# Patient Record
Sex: Female | Born: 1945 | Race: White | Hispanic: No | Smoking: Never smoker
Health system: Southern US, Community
[De-identification: ages and names within clinical notes are randomized; demographics above are authoritative.]

## PROBLEM LIST (undated history)

## (undated) DIAGNOSIS — Z9889 Other specified postprocedural states: Secondary | ICD-10-CM

## (undated) DIAGNOSIS — E785 Hyperlipidemia, unspecified: Secondary | ICD-10-CM

## (undated) DIAGNOSIS — I340 Nonrheumatic mitral (valve) insufficiency: Secondary | ICD-10-CM

## (undated) DIAGNOSIS — R112 Nausea with vomiting, unspecified: Secondary | ICD-10-CM

## (undated) DIAGNOSIS — I491 Atrial premature depolarization: Secondary | ICD-10-CM

## (undated) DIAGNOSIS — M150 Primary generalized (osteo)arthritis: Secondary | ICD-10-CM

## (undated) DIAGNOSIS — I351 Nonrheumatic aortic (valve) insufficiency: Secondary | ICD-10-CM

## (undated) DIAGNOSIS — Z780 Asymptomatic menopausal state: Secondary | ICD-10-CM

## (undated) DIAGNOSIS — M47812 Spondylosis without myelopathy or radiculopathy, cervical region: Secondary | ICD-10-CM

## (undated) DIAGNOSIS — I10 Essential (primary) hypertension: Secondary | ICD-10-CM

## (undated) DIAGNOSIS — I2510 Atherosclerotic heart disease of native coronary artery without angina pectoris: Secondary | ICD-10-CM

## (undated) DIAGNOSIS — M159 Polyosteoarthritis, unspecified: Secondary | ICD-10-CM

## (undated) DIAGNOSIS — M25552 Pain in left hip: Secondary | ICD-10-CM

## (undated) DIAGNOSIS — N390 Urinary tract infection, site not specified: Secondary | ICD-10-CM

## (undated) HISTORY — DX: Essential (primary) hypertension: I10

## (undated) HISTORY — DX: Atherosclerotic heart disease of native coronary artery without angina pectoris: I25.10

## (undated) HISTORY — PX: TUBAL LIGATION: SHX77

## (undated) HISTORY — DX: Nonrheumatic mitral (valve) insufficiency: I34.0

## (undated) HISTORY — DX: Primary generalized (osteo)arthritis: M15.0

## (undated) HISTORY — DX: Spondylosis without myelopathy or radiculopathy, cervical region: M47.812

## (undated) HISTORY — DX: Urinary tract infection, site not specified: N39.0

## (undated) HISTORY — DX: Asymptomatic menopausal state: Z78.0

## (undated) HISTORY — DX: Pain in left hip: M25.552

## (undated) HISTORY — PX: TONSILLECTOMY: SUR1361

## (undated) HISTORY — DX: Nonrheumatic aortic (valve) insufficiency: I35.1

## (undated) HISTORY — PX: DG SCOLIOSIS SERIES (ARMC HX): HXRAD1605

## (undated) HISTORY — PX: REPLACEMENT TOTAL HIP W/  RESURFACING IMPLANTS: SUR1222

## (undated) HISTORY — DX: Polyosteoarthritis, unspecified: M15.9

## (undated) HISTORY — DX: Atrial premature depolarization: I49.1

## (undated) HISTORY — DX: Hyperlipidemia, unspecified: E78.5

---

## 2020-06-09 ENCOUNTER — Other Ambulatory Visit: Payer: Self-pay | Admitting: Family Medicine

## 2020-06-09 DIAGNOSIS — Z1231 Encounter for screening mammogram for malignant neoplasm of breast: Secondary | ICD-10-CM

## 2020-06-26 ENCOUNTER — Other Ambulatory Visit: Payer: Self-pay

## 2020-06-26 ENCOUNTER — Ambulatory Visit
Admission: RE | Admit: 2020-06-26 | Discharge: 2020-06-26 | Disposition: A | Discharge status: Home / Self Care | Payer: Medicare Other | Source: Ambulatory Visit | Attending: Family Medicine | Admitting: Family Medicine

## 2020-06-26 DIAGNOSIS — Z1231 Encounter for screening mammogram for malignant neoplasm of breast: Secondary | ICD-10-CM

## 2020-08-24 ENCOUNTER — Encounter: Payer: Self-pay | Admitting: Physician Assistant

## 2020-08-24 ENCOUNTER — Other Ambulatory Visit: Payer: Self-pay

## 2020-08-24 ENCOUNTER — Ambulatory Visit (INDEPENDENT_AMBULATORY_CARE_PROVIDER_SITE_OTHER): Payer: Medicare Other | Admitting: Physician Assistant

## 2020-08-24 DIAGNOSIS — D485 Neoplasm of uncertain behavior of skin: Secondary | ICD-10-CM

## 2020-08-24 DIAGNOSIS — Z1283 Encounter for screening for malignant neoplasm of skin: Secondary | ICD-10-CM | POA: Diagnosis not present

## 2020-08-24 DIAGNOSIS — B079 Viral wart, unspecified: Secondary | ICD-10-CM

## 2020-08-24 DIAGNOSIS — L570 Actinic keratosis: Secondary | ICD-10-CM | POA: Diagnosis not present

## 2020-08-24 NOTE — Patient Instructions (Signed)

## 2020-08-31 ENCOUNTER — Telehealth: Payer: Self-pay | Admitting: Physician Assistant

## 2020-08-31 NOTE — Telephone Encounter (Signed)
Patient left message on office voice mail saying that she was calling for pathology results from her last visit with [facility], PA-C.

## 2020-08-31 NOTE — Telephone Encounter (Signed)
Called patient to let her know that pathology results aren't back yet and to call again next week.

## 2020-09-11 ENCOUNTER — Encounter: Payer: Self-pay | Admitting: Physician Assistant

## 2020-09-11 NOTE — Progress Notes (Signed)
New Patient   Subjective  Shunte Senseney is a 75 y.o. female who presents for the following: Annual Exam (RIGHT BACK LARGE MOLE WANTS BX).   The following portions of the chart were reviewed this encounter and updated as appropriate:  Tobacco  Allergies  Meds  Problems  Med Hx  Surg Hx  Fam Hx      Objective  Well appearing patient in no apparent distress; mood and affect are within normal limits.  A full examination was performed including scalp, head, eyes, ears, nose, lips, neck, chest, axillae, abdomen, back, buttocks, bilateral upper extremities, bilateral lower extremities, hands, feet, fingers, toes, fingernails, and toenails. All findings within normal limits unless otherwise noted below.  Objective  Chest - [facility]): Full body skin examination- no atypical moles or non mole skin cacner  Objective  Left Parotid Area, Right Cheek: Erythematous patches with gritty scale.  Objective  Right Lower Back: Brown raised macule     Objective  Neck - Anterior: Raised crusty macule      Assessment & Plan  Encounter for screening for malignant neoplasm of skin Chest - Medial (Center)  AK (actinic keratosis) (2) Right Cheek; Left Parotid Area  Destruction of lesion - Left Parotid Area, Right Cheek Complexity: simple   Destruction method: electrodesiccation and curettage   Informed consent: discussed and consent obtained   Timeout:  patient name, date of birth, surgical site, and procedure verified Anesthesia: the lesion was anesthetized in a standard fashion   Anesthetic:  1% lidocaine w/ epinephrine 1-100,000 local infiltration Curettage performed in three different directions: Yes   Curettage cycles:  3 Margin per side (cm):  0.1 Hemostasis achieved with:  aluminum chloride Outcome: patient tolerated procedure well with no complications   Post-procedure details: wound care instructions given    Viral warts, unspecified type Right  Shoulder  Destruction of lesion - Right Shoulder Complexity: simple   Destruction method: cryotherapy   Informed consent: discussed and consent obtained   Timeout:  patient name, date of birth, surgical site, and procedure verified Lesion destroyed using liquid nitrogen: Yes   Cryotherapy cycles:  3 Outcome: patient tolerated procedure well with no complications    Neoplasm of uncertain behavior of skin (2) Right Lower Back  Skin / nail biopsy Type of biopsy: tangential   Informed consent: discussed and consent obtained   Timeout: patient name, date of birth, surgical site, and procedure verified   Procedure prep:  Patient was prepped and draped in usual sterile fashion (Non sterile) Prep type:  Chlorhexidine Anesthesia: the lesion was anesthetized in a standard fashion   Anesthetic:  1% lidocaine w/ epinephrine 1-100,000 local infiltration Instrument used: flexible razor blade   Outcome: patient tolerated procedure well   Post-procedure details: wound care instructions given    Destruction of lesion Complexity: simple   Destruction method: electrodesiccation and curettage   Informed consent: discussed and consent obtained   Timeout:  patient name, date of birth, surgical site, and procedure verified Anesthesia: the lesion was anesthetized in a standard fashion   Anesthetic:  1% lidocaine w/ epinephrine 1-100,000 local infiltration Curettage performed in three different directions: Yes   Curettage cycles:  3 Margin per side (cm):  0.1 Final wound size (cm):  1.5 Hemostasis achieved with:  aluminum chloride Outcome: patient tolerated procedure well with no complications   Post-procedure details: wound care instructions given    Specimen 1 - Surgical pathology Differential Diagnosis: r/o sk Check Margins: No  Neck - Anterior  Skin / nail biopsy Type of biopsy: tangential   Informed consent: discussed and consent obtained   Timeout: patient name, date of birth, surgical  site, and procedure verified   Procedure prep:  Patient was prepped and draped in usual sterile fashion (Non sterile) Prep type:  Chlorhexidine Anesthesia: the lesion was anesthetized in a standard fashion   Anesthetic:  1% lidocaine w/ epinephrine 1-100,000 local infiltration Instrument used: flexible razor blade   Outcome: patient tolerated procedure well   Post-procedure details: wound care instructions given    Destruction of lesion Complexity: simple   Destruction method: electrodesiccation and curettage   Informed consent: discussed and consent obtained   Timeout:  patient name, date of birth, surgical site, and procedure verified Anesthesia: the lesion was anesthetized in a standard fashion   Anesthetic:  1% lidocaine w/ epinephrine 1-100,000 local infiltration Curettage performed in three different directions: Yes   Curettage cycles:  3 Margin per side (cm):  0.1 Final wound size (cm):  1.3 Hemostasis achieved with:  aluminum chloride Outcome: patient tolerated procedure well with no complications   Post-procedure details: wound care instructions given    Specimen 2 - Surgical pathology Differential Diagnosis: wart   Check Margins: No     I, Iana Buzan, PA-C, have reviewed all documentation for this visit. The documentation on 09/11/20 for the exam, diagnosis, procedures, and orders are all accurate and complete.Marland Kitchen

## 2020-09-14 ENCOUNTER — Telehealth: Payer: Self-pay | Admitting: Physician Assistant

## 2020-09-14 NOTE — Telephone Encounter (Signed)
Phone call from patient wanting her pathology results. Results given to patient.  °

## 2020-09-14 NOTE — Telephone Encounter (Signed)
Results, KRS, "have tried for 3 weeks to get result". Call before 10:30 today OR after 3:00

## 2020-09-14 NOTE — Telephone Encounter (Signed)
Left message to call.

## 2021-03-13 ENCOUNTER — Other Ambulatory Visit: Payer: Self-pay | Admitting: Family Medicine

## 2021-03-13 DIAGNOSIS — E2839 Other primary ovarian failure: Secondary | ICD-10-CM

## 2021-03-13 DIAGNOSIS — Z1231 Encounter for screening mammogram for malignant neoplasm of breast: Secondary | ICD-10-CM

## 2021-05-24 ENCOUNTER — Encounter: Payer: Self-pay | Admitting: Cardiology

## 2021-05-24 ENCOUNTER — Other Ambulatory Visit: Payer: Self-pay

## 2021-05-24 ENCOUNTER — Ambulatory Visit (INDEPENDENT_AMBULATORY_CARE_PROVIDER_SITE_OTHER)
Admission: RE | Admit: 2021-05-24 | Discharge: 2021-05-24 | Disposition: A | Discharge status: Home / Self Care | Payer: Self-pay | Source: Ambulatory Visit | Attending: Cardiology | Admitting: Cardiology

## 2021-05-24 ENCOUNTER — Telehealth: Payer: Self-pay

## 2021-05-24 ENCOUNTER — Ambulatory Visit (INDEPENDENT_AMBULATORY_CARE_PROVIDER_SITE_OTHER): Payer: Medicare Other | Admitting: Cardiology

## 2021-05-24 VITALS — BP 142/82 | HR 60 | Ht 60.0 in | Wt 114.0 lb

## 2021-05-24 DIAGNOSIS — I3139 Other pericardial effusion (noninflammatory): Secondary | ICD-10-CM

## 2021-05-24 DIAGNOSIS — I10 Essential (primary) hypertension: Secondary | ICD-10-CM | POA: Diagnosis not present

## 2021-05-24 DIAGNOSIS — I491 Atrial premature depolarization: Secondary | ICD-10-CM | POA: Diagnosis not present

## 2021-05-24 DIAGNOSIS — R9431 Abnormal electrocardiogram [ECG] [EKG]: Secondary | ICD-10-CM | POA: Diagnosis not present

## 2021-05-24 DIAGNOSIS — E7800 Pure hypercholesterolemia, unspecified: Secondary | ICD-10-CM

## 2021-05-24 MED ORDER — METOPROLOL SUCCINATE ER 50 MG PO TB24: ORAL_TABLET | ORAL | 3 refills | Status: DC

## 2021-05-24 MED ORDER — DILTIAZEM HCL ER COATED BEADS 180 MG PO CP24: 180.0000 mg | ORAL_CAPSULE | Freq: Every day | ORAL | 3 refills | Status: DC

## 2021-05-24 NOTE — Telephone Encounter (Signed)
-----   Message from Sueanne Margarita, MD sent at 05/24/2021  1:16 PM EDT ----- Coronary calcium score is 0 with normal coronary origins.  There is a trivial anterior pericardial effusion and trivial mitral annular calcifications.  Cardiogram to assess pericardial effusion further

## 2021-05-24 NOTE — Patient Instructions (Signed)
Medication Instructions:  Your physician recommends that you continue on your current medications as directed. Please refer to the Current Medication list given to you today.  *If you need a refill on your cardiac medications before your next appointment, please call your pharmacy*  Testing/Procedures: Your provider recommends that you have a calcium score CT scan.   Follow-Up: At [facility], you and your health needs are our priority.  As part of our continuing mission to provide you with exceptional heart care, we have created designated Provider Care Teams.  These Care Teams include your primary Cardiologist (physician) and Advanced Practice Providers (APPs -  Physician Assistants and Nurse Practitioners) who all work together to provide you with the care you need, when you need it.  Your next appointment:   1 year(s)  The format for your next appointment:   In Person  Provider:   You may see Fransico Him, MD or one of the following Advanced Practice Providers on your designated Care Team:   Melina Copa, PA-C Ermalinda Barrios, PA-C

## 2021-05-24 NOTE — Progress Notes (Signed)
Cardiology CONSULT Note    Date:  05/24/2021   ID:  [name], DOB March 01, 1946, MRN [id number]  PCP:  Vanessa Lass, MD  Cardiologist:  Vanessa Him, MD   Chief Complaint  Patient presents with   New Patient (Initial Visit)    HTN, PACs, HDL     History of Present Illness:  Vanessa Bonilla is a 75 y.o. female who is being seen today for the evaluation to establish cardiac care at the request of Vanessa Lass, MD.  This is a 75yo female with a hx of HTN, HLD as well as palpitations with PACs on monitor and has been followed at [facility] but recently moved to [place].  She as been on [place] and Toprol for suppression of her palpitations.  She tells me that she recently saw her PCP and she wanted her to go on statin therapy due to CRFs but she has been very hesitant.    She is here today for followup.  She denies any chest pain or pressure, SOB, DOE, PND, orthopnea, LE edema, dizziness, palpitations or syncope. She is compliant with her meds and is tolerating meds with no SE.     Past Medical History:  Diagnosis Date   DJD (degenerative joint disease), cervical    Hyperlipidemia    Hypertension    Left hip pain    Menopause    PAC (premature atrial contraction)    Primary osteoarthritis involving multiple joints    Recurrent UTI     Past Surgical History:  Procedure Laterality Date   DG SCOLIOSIS SERIES ([facility] HX)     REPLACEMENT TOTAL HIP W/  RESURFACING IMPLANTS     TONSILLECTOMY      Current Medications: Current Meds  Medication Sig   aspirin EC 81 MG tablet Take 81 mg by mouth daily. Swallow whole.   Calcium Carb-Cholecalciferol 500-600 MG-UNIT TABS Take by mouth.   cetirizine (ZYRTEC) 10 MG tablet 1 tablet   diclofenac (VOLTAREN) 75 MG EC tablet Take by mouth.   diltiazem (CARDIZEM CD) 180 MG 24 hr capsule    estradiol (ESTRACE) 0.1 MG/GM vaginal cream See admin instructions.   metoprolol succinate (TOPROL-XL) 50 MG 24 hr tablet 1 tablet   Omega-3 1000 MG  CAPS Take by mouth.    Allergies:   Ammonium-containing compounds, Lidocaine hcl, Raloxifene, Macrodantin [nitrofurantoin], and Procaine   Social History   Socioeconomic History   Marital status: Widowed    Spouse name: Not on file   Number of children: Not on file   Years of education: Not on file   Highest education level: Not on file  Occupational History   Not on file  Tobacco Use   Smoking status: Never   Smokeless tobacco: Never  Vaping Use   Vaping Use: Never used  Substance and Sexual Activity   Alcohol use: Yes   Drug use: Never   Sexual activity: Not on file  Other Topics Concern   Not on file  Social History Narrative   Not on file   Social Determinants of Health   Financial Resource Strain: Not on file  Food Insecurity: Not on file  Transportation Needs: Not on file  Physical Activity: Not on file  Stress: Not on file  Social Connections: Not on file     Family History:  The patient's family history includes Alzheimer's disease in her father; Arthritis in her brother; Breast cancer (age of onset: 47) in her sister; CVA in her mother; Cancer in her sister;  Mental illness in her brother; Other in her sister; Pneumonia in her mother.   ROS:   Please see the history of present illness.    ROS All other systems reviewed and are negative.  No flowsheet data found.     PHYSICAL EXAM:   VS:  BP (!) 142/82 (BP Location: Left Arm, Patient Position: Sitting, Cuff Size: Normal)   Pulse 60   Ht 5' (1.524 m)   Wt 114 lb (51.7 kg)   BMI 22.26 kg/m    GEN: Well nourished, well developed, in no acute distress  HEENT: normal  Neck: no JVD, carotid bruits, or masses Cardiac: RRR; no murmurs, rubs, or gallops,no edema.  Intact distal pulses bilaterally.  Respiratory:  clear to auscultation bilaterally, normal work of breathing GI: soft, nontender, nondistended, + BS MS: no deformity or atrophy  Skin: warm and dry, no rash Neuro:  Alert and Oriented x 3,  Strength and sensation are intact Psych: euthymic mood, full affect  Wt Readings from Last 3 Encounters:  05/24/21 114 lb (51.7 kg)      Studies/Labs Reviewed:   EKG:  EKG is ordered today.  The ekg ordered today demonstrates NSR  with nonspecific ST abnormality  Recent Labs: No results found for requested labs within last 8760 hours.   Lipid Panel No results found for: CHOL, TRIG, HDL, CHOLHDL, VLDL, LDLCALC, LDLDIRECT   Additional studies/ records that were reviewed today include:  OV notes from PCP    ASSESSMENT:    1. PAC (premature atrial contraction)   2. Primary hypertension   3. Pure hypercholesterolemia      PLAN:  In order of problems listed above:  PACs -these are well controlled on dual AVN blocking agents  -she has not had any palpitations in several years -continue prescription drug management with Cardizem CD 180mg  daily and Toprol XL 50mg  daily with PRN refills  2.  HTN -BP fairly well controlled on exam today -continue prescription drug management with Cardizem and Toprol  3.  HLD -LDL goal < 100 -I have personally reviewed and interpreted outside Labs performed by patient's PCP which showed LDL 56, HDL 66, TAGs 74 and TChol 137 -she is currently not on statin and at this time see no need to institute statin therapy   4.  Assessment for CAD -she has CRFs with strong family hx of CAD -I will get a coronary Ca score to assess future risk  Time Spent: 25 minutes total time of encounter, including 15 minutes spent in face-to-face patient care on the date of this encounter. This time includes coordination of care and counseling regarding above mentioned problem list. Remainder of non-face-to-face time involved reviewing chart documents/testing relevant to the patient encounter and documentation in the medical record. I have independently reviewed documentation from referring provider  Medication Adjustments/Labs and Tests Ordered: Current medicines  are reviewed at length with the patient today.  Concerns regarding medicines are outlined above.  Medication changes, Labs and Tests ordered today are listed in the Patient Instructions below.  There are no Patient Instructions on file for this visit.   Signed, Vanessa Him, MD  05/24/2021 10:22 AM    [facility] [address], [place], [place]  [address] Phone: [phone]; Fax: [phone]

## 2021-05-24 NOTE — Telephone Encounter (Signed)
The patient has been notified of the result and verbalized understanding.  All questions (if any) were answered. Antonieta Iba, RN 05/24/2021 3:19 PM  Echocardiogram has been scheduled.

## 2021-06-18 ENCOUNTER — Ambulatory Visit (HOSPITAL_COMMUNITY): Payer: Medicare Other | Attending: Cardiology

## 2021-06-18 ENCOUNTER — Other Ambulatory Visit: Payer: Self-pay

## 2021-06-18 DIAGNOSIS — I3139 Other pericardial effusion (noninflammatory): Secondary | ICD-10-CM | POA: Diagnosis not present

## 2021-06-18 LAB — ECHOCARDIOGRAM COMPLETE
Area-P 1/2: 3.21 cm2
S' Lateral: 2.4 cm

## 2021-09-04 ENCOUNTER — Ambulatory Visit
Admission: RE | Admit: 2021-09-04 | Discharge: 2021-09-04 | Disposition: A | Discharge status: Home / Self Care | Payer: Medicare Other | Source: Ambulatory Visit | Attending: Family Medicine | Admitting: Family Medicine

## 2021-09-04 DIAGNOSIS — Z1231 Encounter for screening mammogram for malignant neoplasm of breast: Secondary | ICD-10-CM

## 2021-09-04 DIAGNOSIS — E2839 Other primary ovarian failure: Secondary | ICD-10-CM

## 2022-06-24 ENCOUNTER — Other Ambulatory Visit: Payer: Self-pay | Admitting: Cardiology

## 2022-07-29 ENCOUNTER — Other Ambulatory Visit: Payer: Self-pay | Admitting: Family Medicine

## 2022-07-29 DIAGNOSIS — Z1231 Encounter for screening mammogram for malignant neoplasm of breast: Secondary | ICD-10-CM

## 2022-08-21 ENCOUNTER — Ambulatory Visit: Payer: Medicare Other | Attending: Cardiology | Admitting: Cardiology

## 2022-08-21 ENCOUNTER — Encounter: Payer: Self-pay | Admitting: Cardiology

## 2022-08-21 VITALS — BP 134/82 | HR 54 | Ht 60.0 in | Wt 113.4 lb

## 2022-08-21 DIAGNOSIS — I491 Atrial premature depolarization: Secondary | ICD-10-CM | POA: Diagnosis not present

## 2022-08-21 DIAGNOSIS — E7800 Pure hypercholesterolemia, unspecified: Secondary | ICD-10-CM | POA: Insufficient documentation

## 2022-08-21 DIAGNOSIS — I10 Essential (primary) hypertension: Secondary | ICD-10-CM | POA: Insufficient documentation

## 2022-08-21 DIAGNOSIS — Z01812 Encounter for preprocedural laboratory examination: Secondary | ICD-10-CM

## 2022-08-21 DIAGNOSIS — R079 Chest pain, unspecified: Secondary | ICD-10-CM | POA: Insufficient documentation

## 2022-08-21 DIAGNOSIS — I209 Angina pectoris, unspecified: Secondary | ICD-10-CM

## 2022-08-21 MED ORDER — METOPROLOL TARTRATE 25 MG PO TABS: 25.0000 mg | ORAL_TABLET | Freq: Once | ORAL | 0 refills | Status: DC

## 2022-08-21 NOTE — Progress Notes (Signed)
Cardiology Note    Date:  08/21/2022   ID:  Vanessa Bonilla, DOB 03/29/1946, MRN [id number]  PCP:  Kathyrn Lass, MD  Cardiologist:  Fransico Him, MD   Chief Complaint  Patient presents with   Follow-up    PACs, HTN, HLD     History of Present Illness:  Vanessa Bonilla is a 77 y.o. female with a hx of HTN, HLD as well as palpitations with PACs on monitor and has been followed at [facility] but recently moved to [place].  She as been on [place] and Toprol for suppression of her palpitations.  She had a coronary Ca score done 11/22 that was 0.  She is here today for followup and is doing well.  She tells me that she has had a few episodes of chest pain.  She describes it as a stinging pressure over the upper left chest with no radiation.  It lasts 10-15 minutes and goes away on its own.  Sometimes TUMS helps. There is no nausea, diaphoresis or SOB with the pain.   She denies any SOB, DOE, PND, orthopnea,  dizziness, palpitations or syncope. Rarely she sill have some mild LE edema. She is compliant with her meds and is tolerating meds with no SE.    Past Medical History:  Diagnosis Date   DJD (degenerative joint disease), cervical    Hyperlipidemia    Hypertension    Left hip pain    Menopause    PAC (premature atrial contraction)    Primary osteoarthritis involving multiple joints    Recurrent UTI     Past Surgical History:  Procedure Laterality Date   DG SCOLIOSIS SERIES ([facility] HX)     REPLACEMENT TOTAL HIP W/  RESURFACING IMPLANTS     TONSILLECTOMY      Current Medications: Current Meds  Medication Sig   aspirin EC 81 MG tablet Take 81 mg by mouth daily. Swallow whole.   Calcium Carb-Cholecalciferol 500-600 MG-UNIT TABS Take by mouth.   Carboxymethylcellulose Sod PF (REFRESH PLUS) 0.5 % SOLN Place 1 drop into both eyes in the morning and at bedtime.   cetirizine (ZYRTEC) 10 MG tablet 1 tablet   diltiazem (CARDIZEM CD) 180 MG 24 hr capsule Take 1 capsule (180 mg total)  by mouth daily. Please keep upcoming appointment for future refills. Thank you.   estradiol (ESTRACE) 0.1 MG/GM vaginal cream See admin instructions.   metoprolol succinate (TOPROL-XL) 50 MG 24 hr tablet TAKE ONE TABLET BY MOUTH DAILY. Please keep upcoming appointment for future refills. Thank you.   Multiple Vitamin (MULTIVITAMIN) tablet Take 1 tablet by mouth daily.   Omega-3 1000 MG CAPS Take by mouth.    Allergies:   Ammonium-containing compounds, Lidocaine hcl, Raloxifene, Macrodantin [nitrofurantoin], and Procaine   Social History   Socioeconomic History   Marital status: Widowed    Spouse name: Not on file   Number of children: Not on file   Years of education: Not on file   Highest education level: Not on file  Occupational History   Not on file  Tobacco Use   Smoking status: Never   Smokeless tobacco: Never  Vaping Use   Vaping Use: Never used  Substance and Sexual Activity   Alcohol use: Yes   Drug use: Never   Sexual activity: Not on file  Other Topics Concern   Not on file  Social History Narrative   Not on file   Social Determinants of Health   Financial Resource Strain:  Not on file  Food Insecurity: Not on file  Transportation Needs: Not on file  Physical Activity: Not on file  Stress: Not on file  Social Connections: Not on file     Family History:  The patient's family history includes Alzheimer's disease in her father; Arthritis in her brother; Breast cancer (age of onset: 96) in her sister; CVA in her mother; Cancer in her sister; Mental illness in her brother; Other in her sister; Pneumonia in her mother.   ROS:   Please see the history of present illness.    ROS All other systems reviewed and are negative.      No data to display             PHYSICAL EXAM:   VS:  BP 134/82   Pulse (!) 54   Ht 5' (1.524 m)   Wt 113 lb 6.4 oz (51.4 kg)   SpO2 96%   BMI 22.15 kg/m    GEN: Well nourished, well developed in no acute distress HEENT:  Normal NECK: No JVD; No carotid bruits LYMPHATICS: No lymphadenopathy CARDIAC:RRR, no murmurs, rubs, gallops RESPIRATORY:  Clear to auscultation without rales, wheezing or rhonchi  ABDOMEN: Soft, non-tender, non-distended MUSCULOSKELETAL:  No edema; No deformity  SKIN: Warm and dry NEUROLOGIC:  Alert and oriented x 3 PSYCHIATRIC:  Normal affect  Wt Readings from Last 3 Encounters:  08/21/22 113 lb 6.4 oz (51.4 kg)  05/24/21 114 lb (51.7 kg)      Studies/Labs Reviewed:   EKG:  EKG is ordered today.  The ekg ordered today demonstrates NSR with inferior infarct and LVH by voltage criteria.  Compared to prior EKG there is now a Q wave in aVF  Recent Labs: No results found for requested labs within last 365 days.   Lipid Panel No results found for: "CHOL", "TRIG", "HDL", "CHOLHDL", "VLDL", "LDLCALC", "LDLDIRECT"   Additional studies/ records that were reviewed today include:  OV notes from PCP    ASSESSMENT:    1. PAC (premature atrial contraction)   2. Primary hypertension   3. Pure hypercholesterolemia      PLAN:  In order of problems listed above:  PACs -these are well controlled on dual AVN blocking agents  -she denies any significant palpitations -continue prescription drug management with Toprol XL '50mg'$  daily and Cardizem CD '180mg'$  daily with PRN refills  2.  HTN -BP controlled today but says that it has been all over the place at home -she eats out a lot so may be getting increased Na at those meals -I have asked her to check her BP twice daily for 2 weeks and mark on the days she eats out  -continue prescription drug management with Cardizem CD '180mg'$  daily and Toprol XL '50mg'$  daily with PRN refills  3.  HLD -LDL goal < 100 -followed by PCP  4.  Fm hx of CAD/Chest pain -coronary Ca score was 0 on 11/22 -now having CP that is atypical and has a new Q wave in AVf -I am going to get a coronary CTA to define coronary anatomy   Medication Adjustments/Labs  and Tests Ordered: Current medicines are reviewed at length with the patient today.  Concerns regarding medicines are outlined above.  Medication changes, Labs and Tests ordered today are listed in the Patient Instructions below.  There are no Patient Instructions on file for this visit.   Signed, Fransico Him, MD  08/21/2022 1:55 PM    [facility] [phone]  [address] [place], [place]  [address] Phone: [phone]; Fax: [phone]

## 2022-08-21 NOTE — Patient Instructions (Signed)
Medication Instructions:  Your physician recommends that you continue on your current medications as directed. Please refer to the Current Medication list given to you today.  *If you need a refill on your cardiac medications before your next appointment, please call your pharmacy*   Lab Work: Please complete a BMET in our lab before you leave today.  If you have labs (blood work) drawn today and your tests are completely normal, you will receive your results only by: [place] (if you have MyChart) OR A paper copy in the mail If you have any lab test that is abnormal or we need to change your treatment, we will call you to review the results.   Testing/Procedures:   Your cardiac CT will be scheduled at:   [facility] [address] [place], [place] [address] [phone]  Please arrive at the [facility] and Children's Entrance (Entrance C2) of [facility] 30 minutes prior to test start time. You can use the FREE valet parking offered at entrance C (encouraged to control the heart rate for the test)  Proceed to the [facility] Radiology Department (first floor) to check-in and test prep.  All radiology patients and guests should use entrance C2 at [facility], accessed from [facility], even though the hospital's physical address listed is [address]      Please follow these instructions carefully (unless otherwise directed):  Hold all erectile dysfunction medications at least 3 days (72 hrs) prior to test. (Ie viagra, cialis, sildenafil, tadalafil, etc) We will administer nitroglycerin during this exam.   On the Night Before the Test: Be sure to Drink plenty of water. Do not consume any caffeinated/decaffeinated beverages or chocolate 12 hours prior to your test. Do not take any antihistamines 12 hours prior to your test.  On the Day of the Test: Drink plenty of water until 1 hour prior to the test. Do not eat any  food 1 hour prior to test. You may take your regular medications prior to the test.  Take 25 mg metoprolol (Lopressor) two hours prior to test. This is a one-time dose. HOLD Furosemide/Hydrochlorothiazide morning of the test. FEMALES- please wear underwire-free bra if available, avoid dresses & tight clothing         After the Test: Drink plenty of water. After receiving IV contrast, you may experience a mild flushed feeling. This is normal. On occasion, you may experience a mild rash up to 24 hours after the test. This is not dangerous. If this occurs, you can take Benadryl 25 mg and increase your fluid intake. If you experience trouble breathing, this can be serious. If it is severe call 911 IMMEDIATELY. If it is mild, please call our office. If you take any of these medications: Glipizide/Metformin, Avandament, Glucavance, please do not take 48 hours after completing test unless otherwise instructed.  We will call to schedule your test 2-4 weeks out understanding that some insurance companies will need an authorization prior to the service being performed.   For non-scheduling related questions, please contact the cardiac imaging nurse navigator should you have any questions/concerns: Marchia Bond, Cardiac Imaging Nurse Navigator Gordy Clement, Cardiac Imaging Nurse Navigator [facility] Direct Office Dial: [phone]   For scheduling needs, including cancellations and rescheduling, please call [place], [phone].    Follow-Up: At [facility], you and your health needs are our priority.  As part of our continuing mission to provide you with  exceptional heart care, we have created designated Provider Care Teams.  These Care Teams include your primary Cardiologist (physician) and Advanced Practice Providers (APPs -  Physician Assistants and Nurse Practitioners) who all work together to provide you with the care you need, when you need it.  We  recommend signing up for the patient portal called "MyChart".  Sign up information is provided on this After Visit Summary.  MyChart is used to connect with patients for Virtual Visits (Telemedicine).  Patients are able to view lab/test results, encounter notes, upcoming appointments, etc.  Non-urgent messages can be sent to your provider as well.   To learn more about what you can do with MyChart, go to [email or web address].    Your next appointment:   1 year(s)  Provider:   Fransico Him, MD     Other Instructions Please keep a blood pressure log for 2 weeks and call us with the results. Check your blood pressure in the morning 1-2 hours after you take your blood pressure medicine. Check in again in late afternoon and evening. Record all values for 2 weeks and call our office at [phone] with the results.

## 2022-08-21 NOTE — Addendum Note (Signed)
Addended by: Joni Reining on: 08/21/2022 02:12 PM   Modules accepted: Orders

## 2022-08-22 ENCOUNTER — Other Ambulatory Visit: Payer: Self-pay | Admitting: Cardiology

## 2022-08-22 LAB — BASIC METABOLIC PANEL
BUN/Creatinine Ratio: 13 (ref 12–28)
BUN: 10 mg/dL (ref 8–27)
CO2: 24 mmol/L (ref 20–29)
Calcium: 9.2 mg/dL (ref 8.7–10.3)
Chloride: 105 mmol/L (ref 96–106)
Creatinine, Ser: 0.79 mg/dL (ref 0.57–1.00)
Glucose: 100 mg/dL — ABNORMAL HIGH (ref 70–99)
Potassium: 4.1 mmol/L (ref 3.5–5.2)
Sodium: 143 mmol/L (ref 134–144)
eGFR: 77 mL/min/{1.73_m2} (ref >59)

## 2022-09-09 ENCOUNTER — Encounter: Payer: Self-pay | Admitting: Cardiology

## 2022-09-11 ENCOUNTER — Telehealth (HOSPITAL_COMMUNITY): Payer: Self-pay | Admitting: Emergency Medicine

## 2022-09-11 NOTE — Telephone Encounter (Signed)
Reaching out to patient to offer assistance regarding upcoming cardiac imaging study; pt verbalizes understanding of appt date/time, parking situation and where to check in, pre-test NPO status and medications ordered, and verified current allergies; name and call back number provided for further questions should they arise Vanessa Bond RN Navigator Cardiac Imaging [facility] [phone] office [phone] cell  Arrival 1200 [address] entrance Daily meds Aware contrast/nitro

## 2022-09-12 ENCOUNTER — Ambulatory Visit (HOSPITAL_COMMUNITY)
Admission: RE | Admit: 2022-09-12 | Discharge: 2022-09-12 | Disposition: A | Discharge status: Home / Self Care | Payer: Medicare Other | Source: Ambulatory Visit | Attending: Cardiology | Admitting: Cardiology

## 2022-09-12 DIAGNOSIS — I209 Angina pectoris, unspecified: Secondary | ICD-10-CM | POA: Insufficient documentation

## 2022-09-12 DIAGNOSIS — R079 Chest pain, unspecified: Secondary | ICD-10-CM | POA: Insufficient documentation

## 2022-09-12 MED ORDER — NITROGLYCERIN 0.4 MG SL SUBL
SUBLINGUAL_TABLET | SUBLINGUAL | Status: AC
  Filled 2022-09-12: qty 2

## 2022-09-12 MED ORDER — IOHEXOL 350 MG/ML SOLN
100.0000 mL | Freq: Once | INTRAVENOUS | Status: AC | PRN
  Administered 2022-09-12: 100 mL via INTRAVENOUS

## 2022-09-12 MED ORDER — NITROGLYCERIN 0.4 MG SL SUBL
0.8000 mg | SUBLINGUAL_TABLET | Freq: Once | SUBLINGUAL | Status: AC
  Administered 2022-09-12: 0.8 mg via SUBLINGUAL

## 2022-09-17 ENCOUNTER — Encounter: Payer: Self-pay | Admitting: Cardiology

## 2022-09-17 ENCOUNTER — Ambulatory Visit: Payer: Medicare Other

## 2022-09-19 ENCOUNTER — Telehealth: Payer: Self-pay

## 2022-09-19 NOTE — Telephone Encounter (Signed)
Patient submitted following BP log. Dr. Radford Pax reviewed and recommends no changes. Called patient and let her know Dr. Theodosia Blender recommendation to continue current medications. Patient verbalizes understanding.   08/22/22:  Noon 154/70  HR 53 PM     144/79 HR 57  08/23/22: Noon 124/71   HR 56 PM     141/76  HR 62  08/24/22: Noon  139/82 HR  65 PM     139/62 HR  62  08/25/22: Noon  138/67  HR 49 PM 144/79  HR 55  08/26/22: Noon  144/66 HR 54 PM 144/74 HR 56  08/27/22: Noon 133/69  HR 54 PM 137/73  HR 58  08/28/22: Noon 132/67 HR 61 PM    153/77  HR 64           148/78  HR 56  08/29/22: PM    130/68  HR 59  08/30/22: Noon 125/61  HR 56 PM     140/79  HR 59  08/31/22:  Noon  129/70  HR 56 PM      135/70 HR 56  09/01/22: Noon   134/63 HR 53 PM       147/78  HR 55  09/02/22: PM       150/72  09/03/22: Noon    137/66 HR 54 PM        133/69  HR 56  09/04/22: Noon 131/62 HR 51 PM     140/70 HR 58

## 2022-10-31 ENCOUNTER — Ambulatory Visit
Admission: RE | Admit: 2022-10-31 | Discharge: 2022-10-31 | Disposition: A | Discharge status: Home / Self Care | Payer: Medicare Other | Source: Ambulatory Visit | Attending: Family Medicine | Admitting: Family Medicine

## 2022-10-31 DIAGNOSIS — Z1231 Encounter for screening mammogram for malignant neoplasm of breast: Secondary | ICD-10-CM

## 2023-03-25 ENCOUNTER — Telehealth: Payer: Self-pay | Admitting: Cardiology

## 2023-03-25 NOTE — Telephone Encounter (Signed)
Spoke with patient who is concerned about elevated BP. She reports that she is checking with the same device and today BP was 139/80 but that it has been as high as 194/122.  She has been having some headaches and dizziness.   Advised patient to continue to monitor and keep a record. Provided education on when to see emergency medical care. Will forward to Dr Mayford Knife for review.

## 2023-03-25 NOTE — Telephone Encounter (Signed)
Pt c/o BP issue: STAT if pt c/o blurred vision, one-sided weakness or slurred speech  1. What are your last 5 BP readings?  Today 139/80 Yesterday 179/80  2. Are you having any other symptoms (ex. Dizziness, headache, blurred vision, passed out)? Dizziness, Headaches, feeling like passing out  3. What is your BP issue? Patient's BP has been fluctuating. Patient states her bp has gotten as high as 194/122. Please advise.

## 2023-03-25 NOTE — Telephone Encounter (Signed)
Attempted to return call, unable to leave message.

## 2023-03-26 NOTE — Telephone Encounter (Signed)
Called patient about Dr. Norris Cross advisement. Patient stated she is scheduled to see her PCP on Tuesday next week. Patient stated she could call their office and get appointment earlier. Patient stated last night her BP was 150/80, and she is feeling better now. Patient stated stress is probably causing a lot of her high BPs. Encouraged patient to discuss her stress issues with PCP and discuss other ways to relieve stress, like deep breathing. Patient thank [place] for the call and verbalized understanding and will call her PCP.

## 2023-08-13 ENCOUNTER — Other Ambulatory Visit: Payer: Self-pay | Admitting: Cardiology

## 2023-08-19 ENCOUNTER — Ambulatory Visit: Payer: Medicare Other | Attending: Cardiology | Admitting: Cardiology

## 2023-08-19 ENCOUNTER — Encounter: Payer: Self-pay | Admitting: Cardiology

## 2023-08-19 VITALS — BP 154/80 | HR 65 | Ht 60.0 in | Wt 108.6 lb

## 2023-08-19 DIAGNOSIS — Z79899 Other long term (current) drug therapy: Secondary | ICD-10-CM | POA: Diagnosis present

## 2023-08-19 DIAGNOSIS — I10 Essential (primary) hypertension: Secondary | ICD-10-CM | POA: Insufficient documentation

## 2023-08-19 DIAGNOSIS — I351 Nonrheumatic aortic (valve) insufficiency: Secondary | ICD-10-CM | POA: Diagnosis present

## 2023-08-19 DIAGNOSIS — E7800 Pure hypercholesterolemia, unspecified: Secondary | ICD-10-CM | POA: Insufficient documentation

## 2023-08-19 DIAGNOSIS — I340 Nonrheumatic mitral (valve) insufficiency: Secondary | ICD-10-CM | POA: Insufficient documentation

## 2023-08-19 DIAGNOSIS — R079 Chest pain, unspecified: Secondary | ICD-10-CM | POA: Insufficient documentation

## 2023-08-19 DIAGNOSIS — I491 Atrial premature depolarization: Secondary | ICD-10-CM | POA: Insufficient documentation

## 2023-08-19 MED ORDER — LOSARTAN POTASSIUM 50 MG PO TABS: 50.0000 mg | ORAL_TABLET | Freq: Every day | ORAL | 3 refills | Status: DC

## 2023-08-19 NOTE — Patient Instructions (Signed)
Medication Instructions:  Please INCREASE your dose of losartan to 50 mg daily.  *If you need a refill on your cardiac medications before your next appointment, please call your pharmacy*   Lab Work: Please complete a BMET in one week at any [facility]. You do  not need to be fasting for this test.  If you have labs (blood work) drawn today and your tests are completely normal, you will receive your results only by: MyChart Message (if you have MyChart) OR A paper copy in the mail If you have any lab test that is abnormal or we need to change your treatment, we will call you to review the results.   Testing/Procedures: Your physician has requested that you have an echocardiogram in November 2025. Echocardiography is a painless test that uses sound waves to create images of your heart. It provides your doctor with information about the size and shape of your heart and how well your heart's chambers and valves are working. This procedure takes approximately one hour. There are no restrictions for this procedure. Please do NOT wear cologne, perfume, aftershave, or lotions (deodorant is allowed). Please arrive 15 minutes prior to your appointment time.  Please note: We ask at that you not bring children with you during ultrasound (echo/ vascular) testing. Due to room size and safety concerns, children are not allowed in the ultrasound rooms during exams. Our front office staff cannot provide observation of children in our lobby area while testing is being conducted. An adult accompanying a patient to their appointment will only be allowed in the ultrasound room at the discretion of the ultrasound technician under special circumstances. We apologize for any inconvenience.    Follow-Up:  Your next appointment:   1 year(s)  Provider:   Armanda Magic, MD     Other Instructions Dr. Mayford Knife would like you to be seen in our hypertension clinic for a blood pressure check and a possible medication  adjustment. Please make an appointment at the checkout desk before you leave.

## 2023-08-19 NOTE — Addendum Note (Signed)
Addended by: Luellen Pucker on: 08/19/2023 04:02 PM   Modules accepted: Orders

## 2023-08-19 NOTE — Progress Notes (Signed)
Cardiology Note    Date:  08/19/2023   ID:  [name], DOB [birth year], MRN [id number]  PCP:  Vanessa Hazel, MD  Cardiologist:  Vanessa Magic, MD   Chief Complaint  Patient presents with   Hypertension   Hyperlipidemia   Mitral Regurgitation   Aortic Insuffiency     History of Present Illness:  Vanessa Bonilla is a 78 y.o. female with a hx of HTN, HLD as well as palpitations with PACs on monitor on Cartia and Toprol for suppression of her palpitations.  She had a coronary Ca score done 11/22 that was 0.  She had recurrent chest pain and underwent coronary CTA showing coronary calcium score of 0 and no CAD on 08/2022.  2D echo 2022 showed mild MR and AR.  She is here today for followup and is doing well.  She denies any chest pain or pressure, SOB, DOE, PND, orthopnea, LE edema, dizziness, palpitations or syncope. She is compliant with her meds and is tolerating meds with no SE.    Past Medical History:  Diagnosis Date   Aortic regurgitation    Mild by echo 2022   DJD (degenerative joint disease), cervical    Hyperlipidemia    Hypertension    Left hip pain    Menopause    Mitral regurgitation    Mild by echo 2022   PAC (premature atrial contraction)    Primary osteoarthritis involving multiple joints    Recurrent UTI     Past Surgical History:  Procedure Laterality Date   DG SCOLIOSIS SERIES ([facility] HX)     REPLACEMENT TOTAL HIP W/  RESURFACING IMPLANTS     TONSILLECTOMY      Current Medications: Current Meds  Medication Sig   Calcium Carb-Cholecalciferol 500-600 MG-UNIT TABS Take by mouth daily at 6 (six) AM.   Carboxymethylcellulose Sod PF (REFRESH PLUS) 0.5 % SOLN Place 1 drop into both eyes in the morning and at bedtime.   diltiazem (CARDIZEM CD) 180 MG 24 hr capsule TAKE 1 CAPSULE BY MOUTH DAILY   estradiol (ESTRACE VAGINAL) 0.1 MG/GM vaginal cream Place 1 Applicatorful vaginally 2 (two) times a week.   losartan (COZAAR) 25 MG tablet Take 25 mg by  mouth daily.   metoprolol succinate (TOPROL-XL) 50 MG 24 hr tablet TAKE 1 TABLET BY MOUTH DAILY   Multiple Vitamin (MULTIVITAMIN) tablet Take 1 tablet by mouth daily.    Allergies:   Ammonium-containing compounds, Lidocaine hcl, Raloxifene, Macrodantin [nitrofurantoin], and Procaine   Social History   Socioeconomic History   Marital status: Widowed    Spouse name: Not on file   Number of children: Not on file   Years of education: Not on file   Highest education level: Not on file  Occupational History   Not on file  Tobacco Use   Smoking status: Never   Smokeless tobacco: Never  Vaping Use   Vaping status: Never Used  Substance and Sexual Activity   Alcohol use: Yes   Drug use: Never   Sexual activity: Not on file  Other Topics Concern   Not on file  Social History Narrative   Not on file   Social Drivers of Health   Financial Resource Strain: Low Risk  (10/05/2019)   Received from [facility], [facility]   Overall Financial Resource Strain (CARDIA)    Difficulty of Paying Living Expenses: Not hard at all  Food Insecurity: No Food Insecurity (10/05/2019)   Received from [facility], [facility]  Hunger Vital Sign    Worried About Running Out of Food in the Last Year: Never true    Ran Out of Food in the Last Year: Never true  Transportation Needs: No Transportation Needs (10/05/2019)   Received from [facility], [facility]   PRAPARE - Transportation    Lack of Transportation (Medical): No    Lack of Transportation (Non-Medical): No  Physical Activity: Sufficiently Active (10/05/2019)   Received from [facility], [facility]   Exercise Vital Sign    Days of Exercise per Week: 3 days    Minutes of Exercise per Session: 50 min  Stress: No Stress Concern Present (10/05/2019)   Received from [facility], [facility] of Occupational Health - Occupational Stress Questionnaire    Feeling of Stress : Only a little  Social  Connections: Unknown (12/01/2021)   Received from [facility], [facility]   Social Network    Social Network: Not on file     Family History:  The patient's family history includes Alzheimer's disease in her father; Arthritis in her brother; Breast cancer (age of onset: 23) in her sister; CVA in her mother; Cancer in her sister; Mental illness in her brother; Other in her sister; Pneumonia in her mother.   ROS:   Please see the history of present illness.    ROS All other systems reviewed and are negative.      No data to display             PHYSICAL EXAM:   VS:  BP (!) 154/80 (BP Location: Left Arm, Patient Position: Sitting)   Pulse 65   Ht 5' (1.524 m)   Wt 108 lb 9.6 oz (49.3 kg)   SpO2 97%   BMI 21.21 kg/m    GEN: Well nourished, well developed in no acute distress HEENT: Normal NECK: No JVD; No carotid bruits LYMPHATICS: No lymphadenopathy CARDIAC:RRR, no murmurs, rubs, gallops RESPIRATORY:  Clear to auscultation without rales, wheezing or rhonchi  ABDOMEN: Soft, non-tender, non-distended MUSCULOSKELETAL:  No edema; No deformity  SKIN: Warm and dry NEUROLOGIC:  Alert and oriented x 3 PSYCHIATRIC:  Normal affect  Wt Readings from Last 3 Encounters:  08/19/23 108 lb 9.6 oz (49.3 kg)  08/21/22 113 lb 6.4 oz (51.4 kg)  05/24/21 114 lb (51.7 kg)      Studies/Labs Reviewed:   EKG Interpretation Date/Time:  Tuesday August 19 2023 15:37:45 EST Ventricular Rate:  65 PR Interval:  194 QRS Duration:  106 QT Interval:  430 QTC Calculation: 447 R Axis:   -28  Text Interpretation: Normal sinus rhythm Left ventricular hypertrophy with repolarization abnormality ( Cornell product ) Inferior infarct , age undetermined No previous ECGs available Confirmed by Vanessa Bonilla ([id number]) on 08/19/2023 3:48:58 PM    Recent Labs: 08/21/2022: BUN 10; Creatinine, Ser 0.79; Potassium 4.1; Sodium 143   Lipid Panel No results found for: "CHOL", "TRIG", "HDL", "CHOLHDL",  "VLDL", "LDLCALC", "LDLDIRECT"   Additional studies/ records that were reviewed today include:  OV notes from PCP    ASSESSMENT:    1. PAC (premature atrial contraction)   2. Primary hypertension   3. Pure hypercholesterolemia   4. Chest pain, unspecified type   5. Nonrheumatic mitral valve regurgitation   6. Nonrheumatic aortic valve insufficiency       PLAN:  In order of problems listed above:  PACs -Her palpitations are well-controlled on Toprol and Cardizem -She has not had any further palpitations since I  saw her last -Continue prescription drug management with Cardizem CD 180 mg daily Toprol-XL 50 mg daily with as needed refills  2.  HTN -BP borderline controlled on exam today>>at home runs 147/35mmHg -Continue prescription drug management with Cardizem CD 180 mg daily, Toprol-XL 50 mg daily with as needed refills -increase Losartan to 50mg  daily -BMET in 1 week -Pharm D in 3 weeks  3.  HLD -LDL goal < 100 -followed by PCP  4.  Fm hx of CAD/noncardiac chest pain -coronary Ca score was 0 on 11/22 -Coronary CTA 08/2022 showed coronary calcium score of 0 with normal coronary arteries  5.  Mitral regurgitation/aortic regurgitation -2D echo 06/10/2021 showed mild MR and AR -Repeat 2D echo 05/2024 for reevaluation    Medication Adjustments/Labs and Tests Ordered: Current medicines are reviewed at length with the patient today.  Concerns regarding medicines are outlined above.  Medication changes, Labs and Tests ordered today are listed in the Patient Instructions below.  There are no Patient Instructions on file for this visit.   Signed, Vanessa Magic, MD  08/19/2023 3:48 PM    [facility] [address] [place], [place], [place]  [address] Phone: [phone]; Fax: [phone]

## 2023-08-27 LAB — BASIC METABOLIC PANEL
BUN/Creatinine Ratio: 20 (ref 12–28)
BUN: 15 mg/dL (ref 8–27)
CO2: 25 mmol/L (ref 20–29)
Calcium: 9.2 mg/dL (ref 8.7–10.3)
Chloride: 101 mmol/L (ref 96–106)
Creatinine, Ser: 0.74 mg/dL (ref 0.57–1.00)
Glucose: 155 mg/dL — ABNORMAL HIGH (ref 70–99)
Potassium: 4 mmol/L (ref 3.5–5.2)
Sodium: 140 mmol/L (ref 134–144)
eGFR: 83 mL/min/{1.73_m2} (ref >59)

## 2023-09-29 ENCOUNTER — Other Ambulatory Visit (HOSPITAL_BASED_OUTPATIENT_CLINIC_OR_DEPARTMENT_OTHER): Payer: Self-pay | Admitting: Family Medicine

## 2023-09-29 DIAGNOSIS — Z1231 Encounter for screening mammogram for malignant neoplasm of breast: Secondary | ICD-10-CM

## 2023-10-02 ENCOUNTER — Ambulatory Visit: Payer: Medicare Other | Attending: Cardiovascular Disease | Admitting: Pharmacist

## 2023-10-02 ENCOUNTER — Encounter: Payer: Self-pay | Admitting: Pharmacist

## 2023-10-02 VITALS — BP 120/80 | HR 58

## 2023-10-02 DIAGNOSIS — I10 Essential (primary) hypertension: Secondary | ICD-10-CM | POA: Diagnosis present

## 2023-10-02 NOTE — Assessment & Plan Note (Signed)
 Assessment: BP is controlled in office BP 120/80 mmHg heart rate 58  (goal <130/80) Takes current BP/ heart medications and tolerates them well without any side effects Denies SOB, palpitation, chest pain, headaches,or swelling Home BP ~134/70 heart rate 55  Follows lost salt diet most days of the week and does regular exercise  Correct steps for home BP measurement reviewed with the patient    Plan:  Given normal in office BP keeping medications same  Continue taking diltiazem 180 mg daily, losartan 50 mg daily Toprol XL 50 mg daily  Patient to keep record of BP readings with heart rate and report to [place] at the next visit Patient to see PharmD in 7-8  weeks for follow up  Follow up lab(s): none

## 2023-10-02 NOTE — Patient Instructions (Signed)
 No Changes made by your pharmacist Carmela Hurt, PharmD at today's visit:     Bring all of your meds, your BP cuff and your record of home blood pressures to your next appointment.    HOW TO TAKE YOUR BLOOD PRESSURE AT HOME  Rest 5 minutes before taking your blood pressure.  Don't smoke or drink caffeinated beverages for at least 30 minutes before. Take your blood pressure before (not after) you eat. Sit comfortably with your back supported and both feet on the floor (don't cross your legs). Elevate your arm to heart level on a table or a desk. Use the proper sized cuff. It should fit smoothly and snugly around your bare upper arm. There should be enough room to slip a fingertip under the cuff. The bottom edge of the cuff should be 1 inch above the crease of the elbow. Ideally, take 3 measurements at one sitting and record the average.  Important lifestyle changes to control high blood pressure  Intervention  Effect on the BP  Lose extra pounds and watch your waistline Weight loss is one of the most effective lifestyle changes for controlling blood pressure. If you're overweight or obese, losing even a small amount of weight can help reduce blood pressure. Blood pressure might go down by about 1 millimeter of mercury (mm Hg) with each kilogram (about 2.2 pounds) of weight lost.  Exercise regularly As a general goal, aim for at least 30 minutes of moderate physical activity every [place]. Regular physical activity can lower high blood pressure by about 5 to 8 mm Hg.  Eat a healthy diet Eating a diet rich in whole grains, fruits, vegetables, and low-fat dairy products and low in saturated fat and cholesterol. A healthy diet can lower high blood pressure by up to 11 mm Hg.  Reduce salt (sodium) in your diet Even a small reduction of sodium in the diet can improve heart health and reduce high blood pressure by about 5 to 6 mm Hg.  Limit alcohol One drink equals 12 ounces of beer, 5 ounces of  wine, or 1.5 ounces of 80-proof liquor.  Limiting alcohol to less than one drink a [place] for women or two drinks a [place] for men can help lower blood pressure by about 4 mm Hg.   If you have any questions or concerns please use My Chart to send questions or call the office at [phone]

## 2023-10-02 NOTE — Progress Notes (Signed)
 Patient ID: Vanessa Bonilla                 DOB: [birth year]                      MRN: [id number]      HPI: Vanessa Bonilla is a 78 y.o. female referred by Dr. Mayford Knife to HTN clinic. PMH is significant for HTN, HLD as well as palpitations with PACs on monitor on Cartia and Toprol for suppression of her palpitations.  She had a coronary Ca score done 11/22 that was 0.  She had recurrent chest pain and underwent coronary CTA showing coronary calcium score of 0 and no CAD on 08/2022.  2D echo 2022 showed mild MR and AR   At last visit with Dr. Mayford Knife pt's BP was elevated losartan dose was increased from 25 mg daily to 50 mg daily post BMP was WNL  Today patient presented for follow up. Reports home BP ~ 134/70 heart rate 55. She has been dealing with hypertension problem since she was 78 years old. She has been on betablocker since then. She thinks she has white coat syndrome as her BP get high at OV. She takes current BP meds regularly and tolerates them well. She eats out twice a week but when she eats home doesn't add salt to her food. [facility] and does resistance exercise at home 3- 4 times per week to maintain muscle strength. Report her mother struggled with hypertension problem just like her. Her son has bunch of cardiac problem since he was 78 years old.    Current HTN meds: diltiazem 180 mg daily, losartan 50 mg daily Toprol XL 50 mg daily  Previously tried: none  BP goal: <130/80  Family History:  Relation Problem Comments  Mother (Deceased) CVA, hypertension    Pneumonia     Father (Deceased) Alzheimer's disease     Sister (Deceased) Breast cancer (Age: 76)     Sister (Alive) Cancer BREAST CANCER  Other EYE PROBLEMS    Brother (Alive) Arthritis     Brother Metallurgist) Mental illness    Son  Heart problems since he was 54 years old    Social History:  Alcohol: 2 - 3 glasses of wine per week  Smoking: never  Diet: eats out fair amount.    Exercise: silver sneaker - 2 times per week  Resistance exercise 3-4 times per week    Home BP readings:  135 71 58  133 71 58  148 72 57  139 69 55  140 71 57  138 66 55  132 71 57  129 73 61  139  71 56  138 76 58  113 64 35  134.9091 70.45455 55.18182     Wt Readings from Last 3 Encounters:  08/19/23 108 lb 9.6 oz (49.3 kg)  08/21/22 113 lb 6.4 oz (51.4 kg)  05/24/21 114 lb (51.7 kg)   BP Readings from Last 3 Encounters:  10/02/23 120/80  08/19/23 (!) 154/80  09/12/22 (!) 157/78   Pulse Readings from Last 3 Encounters:  10/02/23 (!) 58  08/19/23 65  09/12/22 60    Renal function: CrCl cannot be calculated (Patient's most recent lab result is older than the maximum 21 days allowed.).  Past Medical History:  Diagnosis Date   Aortic regurgitation    Mild by echo 2022   DJD (degenerative joint disease), cervical  Hyperlipidemia    Hypertension    Left hip pain    Menopause    Mitral regurgitation    Mild by echo 2022   PAC (premature atrial contraction)    Primary osteoarthritis involving multiple joints    Recurrent UTI     Current Outpatient Medications on File Prior to Visit  Medication Sig Dispense Refill   Calcium Carb-Cholecalciferol 500-600 MG-UNIT TABS Take by mouth daily at 6 (six) AM.     Carboxymethylcellulose Sod PF (REFRESH PLUS) 0.5 % SOLN Place 1 drop into both eyes in the morning and at bedtime.     cetirizine (ZYRTEC) 10 MG tablet 1 tablet     diltiazem (CARDIZEM CD) 180 MG 24 hr capsule TAKE 1 CAPSULE BY MOUTH DAILY 90 capsule 3   estradiol (ESTRACE) 0.1 MG/GM vaginal cream See admin instructions.     losartan (COZAAR) 50 MG tablet Take 1 tablet (50 mg total) by mouth daily. 90 tablet 3   metoprolol succinate (TOPROL-XL) 50 MG 24 hr tablet TAKE 1 TABLET BY MOUTH DAILY 90 tablet 3   Multiple Vitamin (MULTIVITAMIN) tablet Take 1 tablet by mouth daily.     No current facility-administered medications on file prior to visit.     Allergies  Allergen Reactions   Ammonium-Containing Compounds Anaphylaxis, Nausea And Vomiting and Shortness Of Breath   Lidocaine Hcl Rash and Swelling   Raloxifene Rash   Macrodantin [Nitrofurantoin] Rash   Procaine Rash    Blood pressure 120/80, pulse (!) 58, SpO2 98%.   Assessment/Plan:  1. Hypertension -  Hypertension Assessment: BP is controlled in office BP 120/80 mmHg heart rate 58  (goal <130/80) Takes current BP/ heart medications and tolerates them well without any side effects Denies SOB, palpitation, chest pain, headaches,or swelling Home BP ~134/70 heart rate 55  Follows lost salt diet most days of the week and does regular exercise  Correct steps for home BP measurement reviewed with the patient    Plan:  Given normal in office BP keeping medications same  Continue taking diltiazem 180 mg daily, losartan 50 mg daily Toprol XL 50 mg daily  Patient to keep record of BP readings with heart rate and report to [place] at the next visit Patient to see PharmD in 7-8  weeks for follow up  Follow up lab(s): none       Thank you  Carmela Hurt, Pharm.D [facility] A Division of [facility] [address] [place], [place] [address]  Phone: [phone]; Fax: [phone]

## 2023-10-03 ENCOUNTER — Encounter: Payer: Self-pay | Admitting: Cardiology

## 2023-10-30 ENCOUNTER — Encounter

## 2023-11-03 ENCOUNTER — Encounter (HOSPITAL_BASED_OUTPATIENT_CLINIC_OR_DEPARTMENT_OTHER): Payer: Self-pay

## 2023-11-03 ENCOUNTER — Ambulatory Visit (HOSPITAL_BASED_OUTPATIENT_CLINIC_OR_DEPARTMENT_OTHER)
Admission: RE | Admit: 2023-11-03 | Discharge: 2023-11-03 | Disposition: A | Discharge status: Home / Self Care | Source: Ambulatory Visit | Attending: Family Medicine | Admitting: Family Medicine

## 2023-11-03 DIAGNOSIS — Z1231 Encounter for screening mammogram for malignant neoplasm of breast: Secondary | ICD-10-CM | POA: Diagnosis present

## 2023-11-28 ENCOUNTER — Ambulatory Visit: Admitting: Pharmacist

## 2023-12-01 ENCOUNTER — Encounter: Payer: Self-pay | Admitting: Pharmacist

## 2023-12-01 ENCOUNTER — Ambulatory Visit: Attending: Cardiology | Admitting: Pharmacist

## 2023-12-01 VITALS — BP 172/82 | HR 68

## 2023-12-01 DIAGNOSIS — I10 Essential (primary) hypertension: Secondary | ICD-10-CM | POA: Diagnosis present

## 2023-12-01 MED ORDER — LOSARTAN POTASSIUM 50 MG PO TABS: 50.0000 mg | ORAL_TABLET | Freq: Two times a day (BID) | ORAL | 3 refills | Status: DC

## 2023-12-01 NOTE — Patient Instructions (Addendum)
 Changes made by your pharmacist Nickola Baron, PharmD at today's visit:    Instructions/Changes  (what do you need to do) Your Notes  (what you did and when you did it)  Increase losartan  50 mg daily to twice daily    Continue taking diltiazem  180 mg daily, Toprol  XL 50 mg daily    Lower salt intake     Bring all of your meds, your BP cuff and your record of home blood pressures to your next appointment.    HOW TO TAKE YOUR BLOOD PRESSURE AT HOME  Rest 5 minutes before taking your blood pressure.  Don't smoke or drink caffeinated beverages for at least 30 minutes before. Take your blood pressure before (not after) you eat. Sit comfortably with your back supported and both feet on the floor (don't cross your legs). Elevate your arm to heart level on a table or a desk. Use the proper sized cuff. It should fit smoothly and snugly around your bare upper arm. There should be enough room to slip a fingertip under the cuff. The bottom edge of the cuff should be 1 inch above the crease of the elbow. Ideally, take 3 measurements at one sitting and record the average.  Important lifestyle changes to control high blood pressure  Intervention  Effect on the BP  Lose extra pounds and watch your waistline Weight loss is one of the most effective lifestyle changes for controlling blood pressure. If you're overweight or obese, losing even a small amount of weight can help reduce blood pressure. Blood pressure might go down by about 1 millimeter of mercury (mm Hg) with each kilogram (about 2.2 pounds) of weight lost.  Exercise regularly As a general goal, aim for at least 30 minutes of moderate physical activity every day. Regular physical activity can lower high blood pressure by about 5 to 8 mm Hg.  Eat a healthy diet Eating a diet rich in whole grains, fruits, vegetables, and low-fat dairy products and low in saturated fat and cholesterol. A healthy diet can lower high blood pressure by up to 11 mm  Hg.  Reduce salt (sodium) in your diet Even a small reduction of sodium in the diet can improve heart health and reduce high blood pressure by about 5 to 6 mm Hg.  Limit alcohol One drink equals 12 ounces of beer, 5 ounces of wine, or 1.5 ounces of 80-proof liquor.  Limiting alcohol to less than one drink a day for women or two drinks a day for men can help lower blood pressure by about 4 mm Hg.   If you have any questions or concerns please use My Chart to send questions or call the office at [phone]

## 2023-12-01 NOTE — Progress Notes (Signed)
 Patient ID: MAEVRY PEDDLE                 DOB: 03-07-1946                      MRN: [id number]      HPI: Vanessa Bonilla is a 78 y.o. female referred by Dr. Micael Adas to HTN clinic. PMH is significant for HTN, HLD as well as palpitations with PACs on monitor on Cartia  and Toprol  for suppression of her palpitations.  She had a coronary Ca score done 11/22 that was 0.  She had recurrent chest pain and underwent coronary CTA showing coronary calcium score of 0 and no CAD on 08/2022.  2D echo 2022 showed mild MR and AR   At last visit with Dr. Micael Adas pt's BP was elevated losartan  dose was increased from 25 mg daily to 50 mg daily post BMP was WNL  At last visit with me  Reported home BP ~ 134/70 heart rate 55. She has been dealing with hypertension problem since she was 78 years old. She has been on betablocker since then. She thinks she has white coat syndrome as her BP get high at OV. She takes current BP meds regularly and tolerates them well. She eats out twice a week but when she eats home doesn't add salt to her food. [facility] and does resistance exercise at home 3- 4 times per week to maintain muscle strength. Reported her mother struggled with hypertension problem just like her. Her son has bunch of cardiac problem since he was 78 years old.   Today patient presented for follow up. Brought in home BP monitor for validation. Found to be accurate. Reports her morning BP runs high but later during afternoon it goes in <130/80 range. Her PCP has enrolled in some sort of BP study, she checks her BP on cuff that is provided by PCP. When BP high in 170 range she gets headaches but no other symptoms. Driving over here to [place] stressful for her and she get anxious at OV. In agreement to change losartan  dose from once a day to twice daily but then on wants to continue with PCP for BP management as that is very close to the home.   1st reading on home cuff 172/86  heart rate 65  1st reading on office cuff 174/77 heart rate 65  2nd reading on home cuff 178/86 heart rate 63  2nd reading on office cuff  172/82 heart rate 68  Current HTN meds: diltiazem  180 mg daily, losartan  50 mg daily Toprol  XL 50 mg daily  Previously tried: none  BP goal: <130/80  Family History:  Relation Problem Comments  Mother (Deceased) CVA, hypertension    Pneumonia     Father (Deceased) Alzheimer's disease     Sister (Deceased) Breast cancer (Age: 8)     Sister Metallurgist) Cancer BREAST CANCER  Other EYE PROBLEMS    Brother (Alive) Arthritis     Brother Metallurgist) Mental illness    Son  Heart problems since he was 78 years old    Social History:  Alcohol: 2 - 3 glasses of wine per week  Smoking: never  Diet: eats out fair amount.   Exercise: silver sneaker - 2 times per week  Resistance exercise 3-4 times per week    Home BP readings:  SBP  DBP HR  143 75 72  152 70 58  135 75 59  117 59 61  124 60 69  169 76 60  151 76 60  118 72 66  173 85 57  138 79 62  122  75 54  136 74 61  112 62 56  164 75 64  139.5714 72.35714 61.35714      Wt Readings from Last 3 Encounters:  08/19/23 108 lb 9.6 oz (49.3 kg)  08/21/22 113 lb 6.4 oz (51.4 kg)  05/24/21 114 lb (51.7 kg)   BP Readings from Last 3 Encounters:  12/01/23 (!) 172/82  10/02/23 120/80  08/19/23 (!) 154/80   Pulse Readings from Last 3 Encounters:  12/01/23 68  10/02/23 (!) 58  08/19/23 65    Renal function: CrCl cannot be calculated (Patient's most recent lab result is older than the maximum 21 days allowed.).  Past Medical History:  Diagnosis Date   Aortic regurgitation    Mild by echo 2022   DJD (degenerative joint disease), cervical    Hyperlipidemia    Hypertension    Left hip pain    Menopause    Mitral regurgitation    Mild by echo 2022   PAC (premature atrial contraction)    Primary osteoarthritis involving multiple joints    Recurrent UTI     Current Outpatient  Medications on File Prior to Visit  Medication Sig Dispense Refill   Calcium Carb-Cholecalciferol 500-600 MG-UNIT TABS Take by mouth daily at 6 (six) AM.     Carboxymethylcellulose Sod PF (REFRESH PLUS) 0.5 % SOLN Place 1 drop into both eyes in the morning and at bedtime.     cetirizine (ZYRTEC) 10 MG tablet 1 tablet     diltiazem  (CARDIZEM  CD) 180 MG 24 hr capsule TAKE 1 CAPSULE BY MOUTH DAILY 90 capsule 3   estradiol (ESTRACE) 0.1 MG/GM vaginal cream See admin instructions.     metoprolol  succinate (TOPROL -XL) 50 MG 24 hr tablet TAKE 1 TABLET BY MOUTH DAILY 90 tablet 3   Multiple Vitamin (MULTIVITAMIN) tablet Take 1 tablet by mouth daily.     No current facility-administered medications on file prior to visit.    Allergies  Allergen Reactions   Ammonium-Containing Compounds Anaphylaxis, Nausea And Vomiting and Shortness Of Breath   Lidocaine Hcl Rash and Swelling   Raloxifene Rash   Macrodantin [Nitrofurantoin] Rash   Procaine Rash    Blood pressure (!) 172/82, pulse 68.   Assessment/Plan:  1. Hypertension -  Hypertension Assessment: BP is controlled in office BP 172/82 mmHg heart rate 68  (goal <130/80) Takes current BP/ heart medications and tolerates them well without any side effects Denies SOB, palpitation, chest pain, headaches,or swelling Home BP ~139/72 heart rate 61 with many elevated readings in the morning and few at goal readings in the afternoon  Follows lost salt diet most days of the week and does regular exercise    Plan:  Will up losartan  dose 50 mg daily to twice daily  Continue taking diltiazem  180 mg daily, Toprol  XL 50 mg daily  Pt's decision - Our office is too far to come for follow up will continue with PCP for BP follow up;currently enrolled in BP study at PCP  Follow up lab : BMP in 2 weeks at PCP office     Thank you  Nickola Baron, Pharm.D [facility] A Division of [facility] [address] [place],  [place] [address]  Phone: [phone]; Fax: [phone]

## 2023-12-01 NOTE — Assessment & Plan Note (Addendum)
 Assessment: BP is controlled in office BP 172/82 mmHg heart rate 68  (goal <130/80) Takes current BP/ heart medications and tolerates them well without any side effects Denies SOB, palpitation, chest pain, headaches,or swelling Home BP ~139/72 heart rate 61 with many elevated readings in the morning and few at goal readings in the afternoon  Follows lost salt diet most days of the week and does regular exercise    Plan:  Will up losartan  dose 50 mg daily to twice daily  Continue taking diltiazem  180 mg daily, Toprol  XL 50 mg daily  Pt's decision - Our office is too far to come for follow up will continue with PCP for BP follow up;currently enrolled in BP study at PCP  Follow up lab : BMP in 2 weeks at PCP office

## 2023-12-02 IMAGING — MG MM DIGITAL SCREENING BILAT W/ TOMO AND CAD
6 of 10 series · 6 of 30 positions shown · non-contrast
Comparison: Previous exam(s).

CLINICAL DATA: Screening.

EXAM:
DIGITAL SCREENING BILATERAL MAMMOGRAM WITH TOMOSYNTHESIS AND CAD
TECHNIQUE: Bilateral screening digital craniocaudal and mediolateral oblique
mammograms were obtained. Bilateral screening digital breast
tomosynthesis was performed. The images were evaluated with
computer-aided detection.

[R CC synth-2D]
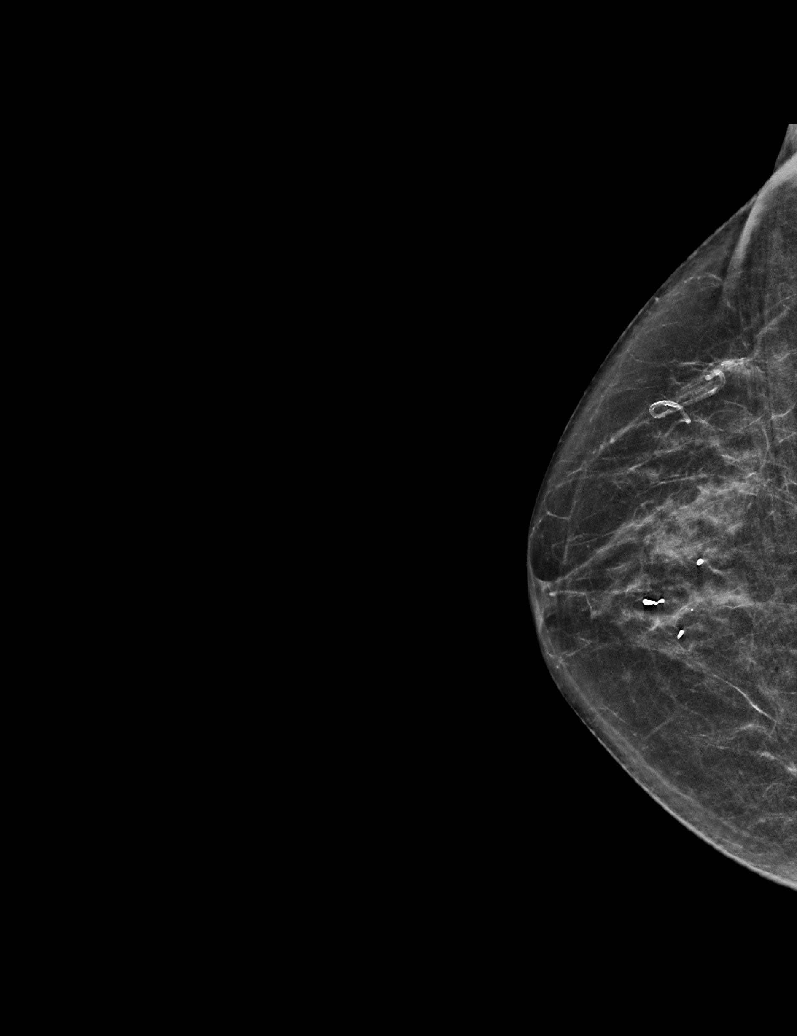

[R MLO synth-2D (1 of 2)]
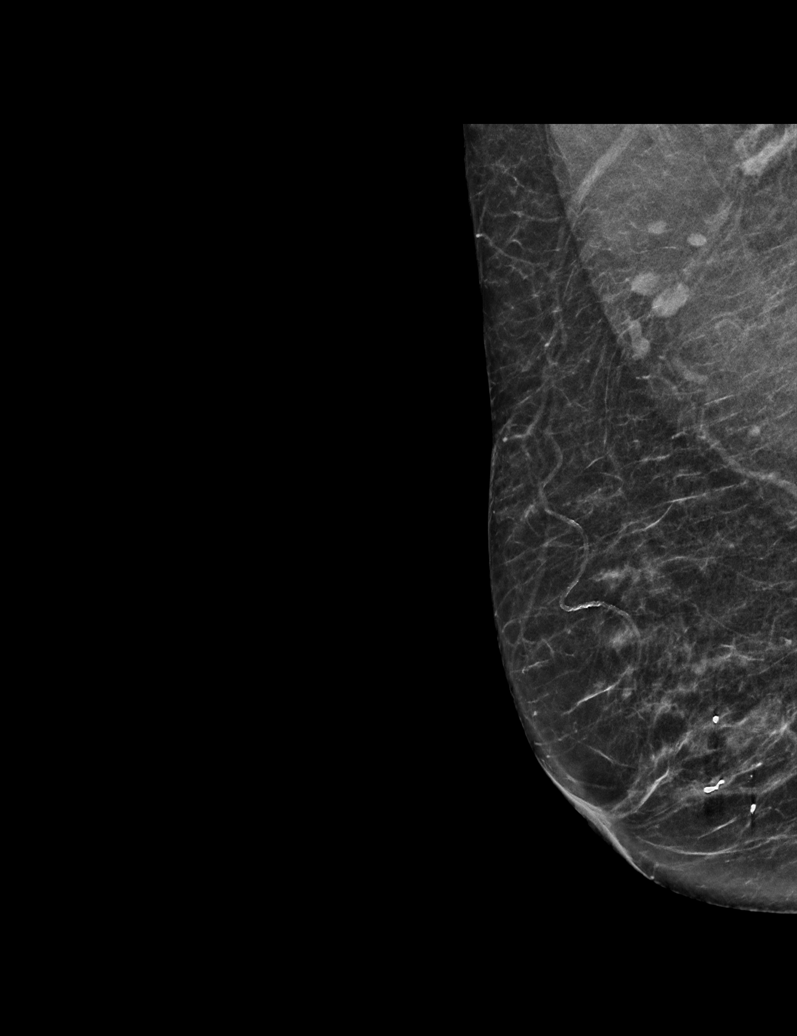

[L CC synth-2D]
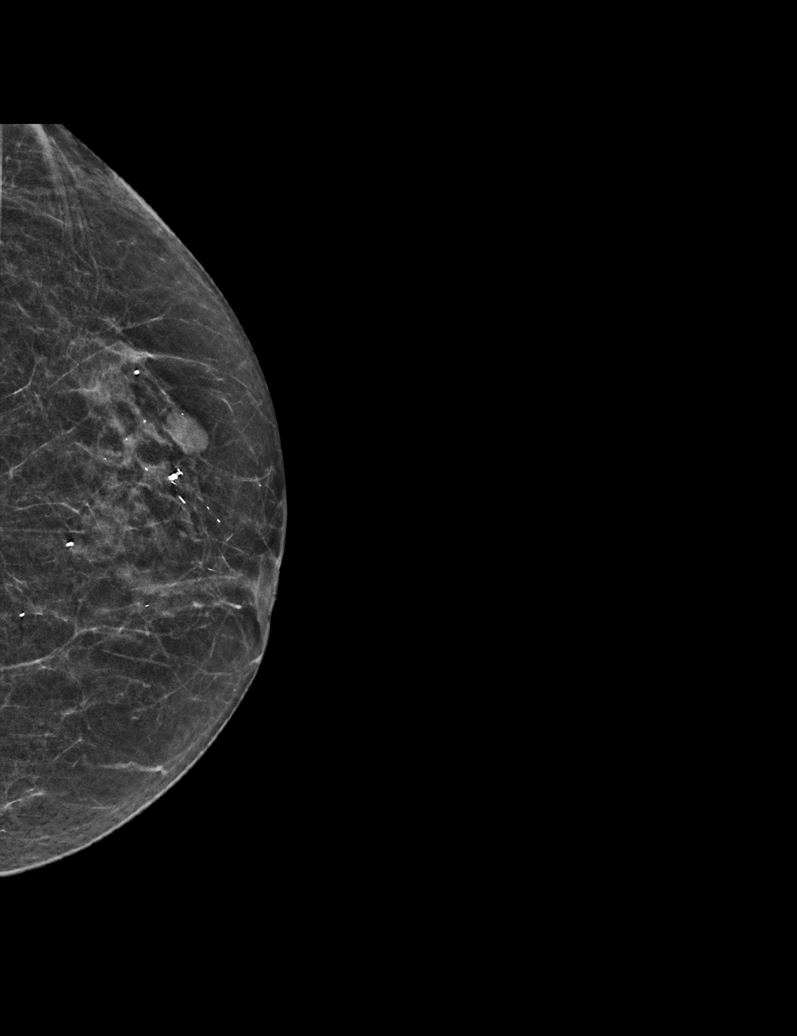

[R MLO synth-2D (2 of 2)]
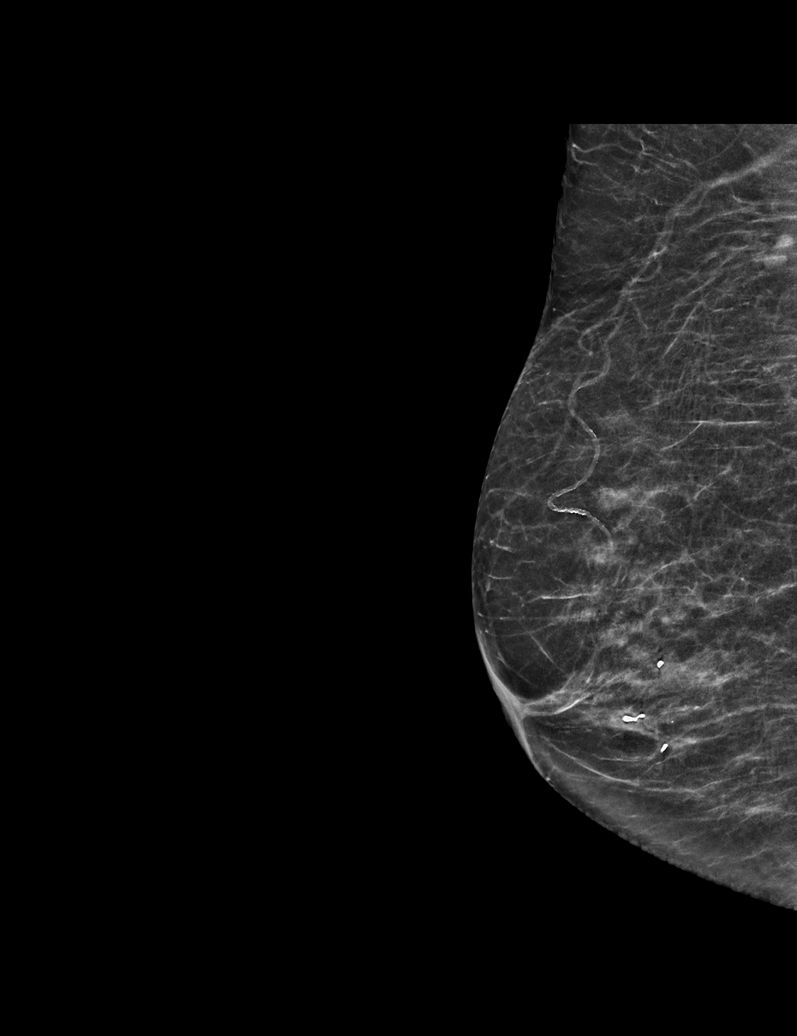

[L MLO synth-2D]
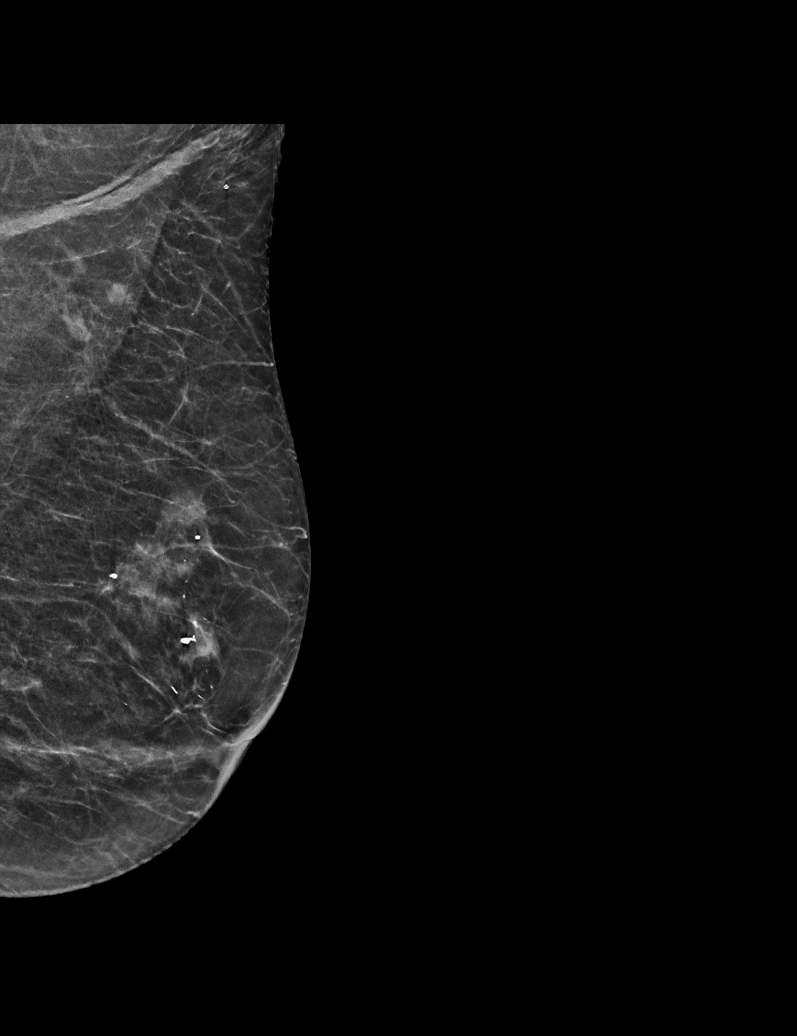

[R MLO tomo · tomo slice 26/51.0]
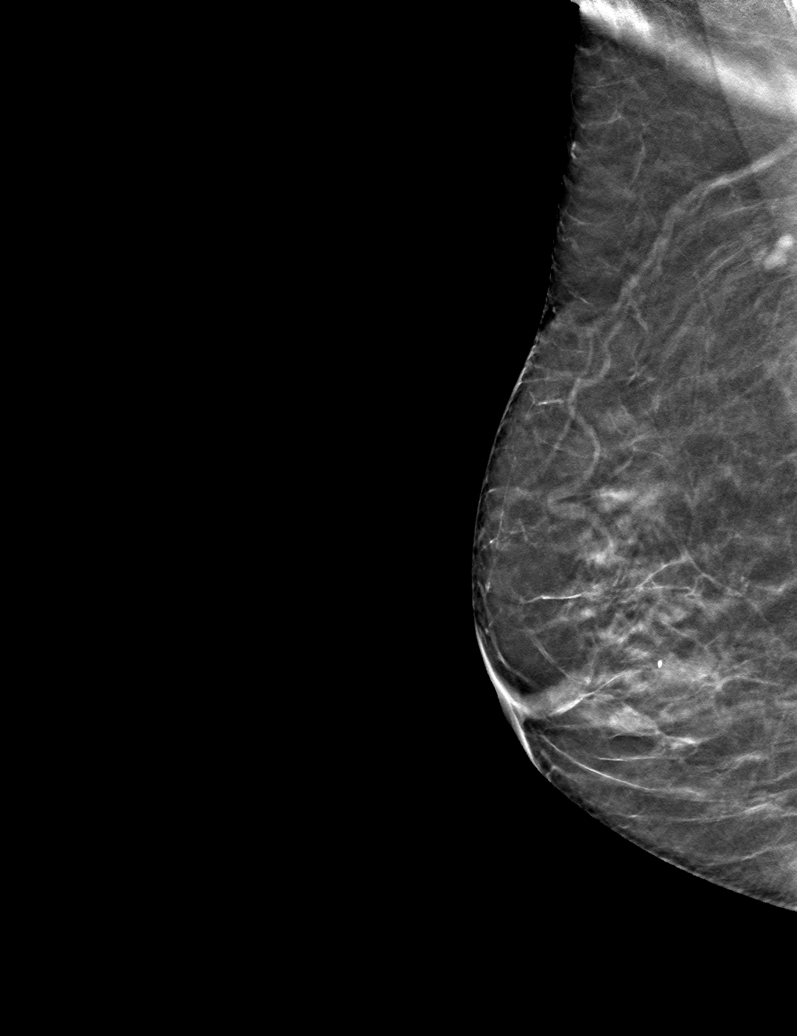

[6 of 30 positions shown; findings below may reference images not displayed]

ACR Breast Density Category b: There are scattered areas of
fibroglandular density.
FINDINGS: There are no findings suspicious for malignancy.
IMPRESSION: No mammographic evidence of malignancy. A result letter of this
screening mammogram will be mailed directly to the patient.

RECOMMENDATION:
Screening mammogram in one year. (Code:51-O-LD2)

BI-RADS CATEGORY  1: Negative.

## 2023-12-03 ENCOUNTER — Telehealth: Payer: Self-pay

## 2023-12-03 ENCOUNTER — Telehealth: Payer: Self-pay | Admitting: *Deleted

## 2023-12-03 NOTE — Telephone Encounter (Signed)
 Pt has been scheduled tele preop appt 02/06/24. Med rec and consent are done. Pt asked if her echo for 05/2024 will need to be moved up sooner for preop clearance. I assured the pt that I will find out and if so will have the echo scheduler call her with a sooner appt.     Patient Consent for Virtual Visit        Vanessa Bonilla has provided verbal consent on 12/03/2023 for a virtual visit (video or telephone).   CONSENT FOR VIRTUAL VISIT FOR:  Vanessa Bonilla  By participating in this virtual visit I agree to the following:  I hereby voluntarily request, consent and authorize [facility] and its employed or contracted physicians, physician assistants, nurse practitioners or other licensed health care professionals (the Practitioner), to provide me with telemedicine health care services (the "Services") as deemed necessary by the treating Practitioner. I acknowledge and consent to receive the Services by the Practitioner via telemedicine. I understand that the telemedicine visit will involve communicating with the Practitioner through live audiovisual communication technology and the disclosure of certain medical information by electronic transmission. I acknowledge that I have been given the opportunity to request an in-person assessment or other available alternative prior to the telemedicine visit and am voluntarily participating in the telemedicine visit.  I understand that I have the right to withhold or withdraw my consent to the use of telemedicine in the course of my care at any time, without affecting my right to future care or treatment, and that the Practitioner or I may terminate the telemedicine visit at any time. I understand that I have the right to inspect all information obtained and/or recorded in the course of the telemedicine visit and may receive copies of available information for a reasonable fee.  I understand that some of the potential risks of receiving the  Services via telemedicine include:  Delay or interruption in medical evaluation due to technological equipment failure or disruption; Information transmitted may not be sufficient (e.g. poor resolution of images) to allow for appropriate medical decision making by the Practitioner; and/or  In rare instances, security protocols could fail, causing a breach of personal health information.  Furthermore, I acknowledge that it is my responsibility to provide information about my medical history, conditions and care that is complete and accurate to the best of my ability. I acknowledge that Practitioner's advice, recommendations, and/or decision may be based on factors not within their control, such as incomplete or inaccurate data provided by me or distortions of diagnostic images or specimens that may result from electronic transmissions. I understand that the practice of medicine is not an exact science and that Practitioner makes no warranties or guarantees regarding treatment outcomes. I acknowledge that a copy of this consent can be made available to me via my patient portal [facility] MyChart), or I can request a printed copy by calling the office of [facility].    I understand that my insurance will be billed for this visit.   I have read or had this consent read to me. I understand the contents of this consent, which adequately explains the benefits and risks of the Services being provided via telemedicine.  I have been provided ample opportunity to ask questions regarding this consent and the Services and have had my questions answered to my satisfaction. I give my informed consent for the services to be provided through the use of telemedicine in my medical care

## 2023-12-03 NOTE — Telephone Encounter (Signed)
   Name: Vanessa Bonilla  DOB: 1945/09/23  MRN: [id number]  Primary Cardiologist: [name], MD   Preoperative team, please contact this patient and set up a phone call appointment ON or AFTER 01/14/2024 for further preoperative risk assessment. Please obtain consent and complete medication review. Thank you for your help.  I confirm that guidance regarding antiplatelet and oral anticoagulation therapy has been completed and, if necessary, noted below (none requested).   I also confirmed the patient resides in the state of [place] . As per [facility] Medical Board telemedicine laws, the patient must reside in the state in which the provider is licensed.   Jude Norton, NP 12/03/2023, 11:29 AM [facility]

## 2023-12-03 NOTE — Telephone Encounter (Signed)
   Pre-operative Risk Assessment    Patient Name: Vanessa Bonilla  DOB: 15-Jan-1946 MRN: [id number]   Date of last office visit: 08/19/23 [name], MD Date of next office visit: NONE   Request for Surgical Clearance    Procedure:  RIGHT TOTAL HIP ARTHROPLASTY  Date of Surgery:  Clearance 03/15/24                                Surgeon:  DR Liliane Rei Surgeon's Group or Practice Name:  [facility] Phone number:  [phone] Fax number:  [phone] ATTN: KERRI MAZE   Type of Clearance Requested:   - Medical    Type of Anesthesia:  CHOICE   Additional requests/questions:    SignedCollin Deal   12/03/2023, 11:15 AM

## 2023-12-03 NOTE — Telephone Encounter (Signed)
 Pt has been scheduled tele preop appt 02/06/24. Med rec and consent are done. Pt asked if her echo for 05/2024 will need to be moved up sooner for preop clearance. I assured the pt that I will find out and if so will have the echo scheduler call her with a sooner appt.

## 2024-01-07 ENCOUNTER — Other Ambulatory Visit: Payer: Self-pay | Admitting: Family Medicine

## 2024-01-07 DIAGNOSIS — R29898 Other symptoms and signs involving the musculoskeletal system: Secondary | ICD-10-CM

## 2024-02-03 ENCOUNTER — Ambulatory Visit
Admission: RE | Admit: 2024-02-03 | Discharge: 2024-02-03 | Disposition: A | Discharge status: Home / Self Care | Source: Ambulatory Visit | Attending: Family Medicine | Admitting: Family Medicine

## 2024-02-03 DIAGNOSIS — R29898 Other symptoms and signs involving the musculoskeletal system: Secondary | ICD-10-CM

## 2024-02-03 MED ORDER — GADOPICLENOL 0.5 MMOL/ML IV SOLN
5.0000 mL | Freq: Once | INTRAVENOUS | Status: AC | PRN
  Administered 2024-02-03: 5 mL via INTRAVENOUS

## 2024-02-06 ENCOUNTER — Ambulatory Visit: Attending: Cardiovascular Disease | Admitting: Emergency Medicine

## 2024-02-06 DIAGNOSIS — Z01810 Encounter for preprocedural cardiovascular examination: Secondary | ICD-10-CM

## 2024-02-06 NOTE — Progress Notes (Signed)
 Virtual Visit via Telephone Note   Because of Vanessa Bonilla co-morbid illnesses, she is at least at moderate risk for complications without adequate follow up.  This format is felt to be most appropriate for this patient at this time.  Due to technical limitations with video connection (technology), today's appointment will be conducted as an audio only telehealth visit, and Vanessa Bonilla verbally agreed to proceed in this manner.   All issues noted in this document were discussed and addressed.  No physical exam could be performed with this format.  Evaluation Performed:  Preoperative cardiovascular risk assessment _____________   Date:  02/06/2024   Patient ID:  Vanessa Bonilla, DOB 1946/03/14, MRN [id number] Patient Location:  Home Provider location:   Office  Primary Care Provider:  Cleotilde Planas, MD Primary Cardiologist:  Wilbert Bihari, MD  Chief Complaint / Patient Profile   78 y.o. y/o female with a h/o hypertension, hyperlipidemia, mitral regurgitation, aortic insufficiency, PACs who is pending Right total hip arthroplasty on 03/24/2024 with [facility] and presents today for telephonic preoperative cardiovascular risk assessment.  History of Present Illness    Vanessa Bonilla is a 78 y.o. female who presents via audio/video conferencing for a telehealth visit today.  Pt was last seen in cardiology clinic on 08/19/2023 by Dr. Bihari.  At that time Vanessa Bonilla was doing well.  The patient is now pending procedure as outlined above. Since her last visit, she denies chest pain, shortness of breath, lower extremity edema, fatigue, palpitations, melena, hematuria, hemoptysis, diaphoresis, weakness, presyncope, syncope, orthopnea, and PND.  Today patient is doing well overall.  She is without acute cardiovascular concerns or complaints at this time.  She denies any anginal symptoms, chest pain, dyspnea.  She maintains a relatively active lifestyle that is limited due  to her severe hip pain.  She does PT several times a month without exertional symptoms.  She cleans her house and does household chores without limitation.  She goes to the grocery store loads her car and unloads without exertional symptoms.  Overall she is able to complete greater than 4 METS.  Past Medical History    Past Medical History:  Diagnosis Date   Aortic regurgitation    Mild by echo 2022   DJD (degenerative joint disease), cervical    Hyperlipidemia    Hypertension    Left hip pain    Menopause    Mitral regurgitation    Mild by echo 2022   PAC (premature atrial contraction)    Primary osteoarthritis involving multiple joints    Recurrent UTI    Past Surgical History:  Procedure Laterality Date   DG SCOLIOSIS SERIES ([facility] HX)     REPLACEMENT TOTAL HIP W/  RESURFACING IMPLANTS     TONSILLECTOMY      Allergies  Allergies  Allergen Reactions   Ammonium-Containing Compounds Anaphylaxis, Nausea And Vomiting and Shortness Of Breath   Lidocaine Hcl Rash and Swelling   Raloxifene Rash   Macrodantin [Nitrofurantoin] Rash   Procaine Rash    Home Medications    Prior to Admission medications   Medication Sig Start Date End Date Taking? Authorizing Provider  Calcium Carb-Cholecalciferol 500-600 MG-UNIT TABS Take by mouth daily at 6 (six) AM.    [provider]  Carboxymethylcellulose Sod PF (REFRESH PLUS) 0.5 % SOLN Place 1 drop into both eyes in the morning and at bedtime.    [provider]  cetirizine (ZYRTEC) 10 MG tablet 1 tablet  [provider]  diltiazem  (CARDIZEM  CD) 180 MG 24 hr capsule TAKE 1 CAPSULE BY MOUTH DAILY 08/14/23   Shlomo Wilbert SAUNDERS, MD  estradiol (ESTRACE) 0.1 MG/GM vaginal cream See admin instructions. 10/05/19   [provider]  losartan  (COZAAR ) 50 MG tablet Take 1 tablet (50 mg total) by mouth in the morning and at bedtime. 12/01/23 02/29/24  Shlomo Wilbert SAUNDERS, MD  metoprolol  succinate (TOPROL -XL) 50 MG 24 hr  tablet TAKE 1 TABLET BY MOUTH DAILY 08/14/23   Shlomo Wilbert SAUNDERS, MD  Multiple Vitamin (MULTIVITAMIN) tablet Take 1 tablet by mouth daily.    [provider]    Physical Exam    Vital Signs:  Vanessa Bonilla does not have vital signs available for review today.  Given telephonic nature of communication, physical exam is limited. AAOx3. NAD. Normal affect.  Speech and respirations are unlabored.  Accessory Clinical Findings    None  Assessment & Plan    1.  Preoperative Cardiovascular Risk Assessment: According to the Revised Cardiac Risk Index (RCRI), her Perioperative Risk of Major Cardiac Event is (%): 0.4. Her Functional Capacity in METs is: 5.07 according to the [facility] Activity Status Index (DASI). Therefore, based on ACC/AHA guidelines, patient would be at acceptable risk for the planned procedure without further cardiovascular testing. I will route this recommendation to the requesting party via Epic fax function.  The patient was advised that if she develops new symptoms prior to surgery to contact our office to arrange for a follow-up visit, and she verbalized understanding.  A copy of this note will be routed to requesting surgeon.  Time:   Today, I have spent 7 minutes with the patient with telehealth technology discussing medical history, symptoms, and management plan.     Vanessa LITTIE Louis, NP  02/06/2024, 1:29 PM

## 2024-02-14 ENCOUNTER — Other Ambulatory Visit

## 2024-02-25 NOTE — H&P (Signed)
 TOTAL HIP ADMISSION H&P  Patient is admitted for right total hip arthroplasty.  Subjective:  Chief Complaint: Right hip pain  HPI: Vanessa Bonilla, 78 y.o. female, has a history of pain and functional disability in the right hip due to arthritis and patient has failed non-surgical conservative treatments for greater than 12 weeks to include corticosteriod injections, use of assistive devices, and activity modification. Onset of symptoms was gradual, starting several years ago with gradually worsening course since that time. The patient noted no past surgery on the right hip. Patient currently rates pain in the right hip at 8 out of 10 with activity. Patient has worsening of pain with activity and weight bearing, pain that interfers with activities of daily living, and joint swelling. Patient has evidence of periarticular osteophytes and joint space narrowing by imaging studies. This condition presents safety issues increasing the risk of falls. There is no current active infection.  Patient Active Problem List   Diagnosis Date Noted   Hypertension    Aortic regurgitation    Mitral regurgitation    PAC (premature atrial contraction) 05/24/2021    Past Medical History:  Diagnosis Date   Aortic regurgitation    Mild by echo 2022   DJD (degenerative joint disease), cervical    Hyperlipidemia    Hypertension    Left hip pain    Menopause    Mitral regurgitation    Mild by echo 2022   PAC (premature atrial contraction)    Primary osteoarthritis involving multiple joints    Recurrent UTI     Past Surgical History:  Procedure Laterality Date   DG SCOLIOSIS SERIES ([facility] HX)     REPLACEMENT TOTAL HIP W/  RESURFACING IMPLANTS     TONSILLECTOMY      Prior to Admission medications   Medication Sig Start Date End Date Taking? Authorizing Provider  Calcium Carb-Cholecalciferol 500-600 MG-UNIT TABS Take by mouth daily at 6 (six) AM.    [provider]  Carboxymethylcellulose  Sod PF (REFRESH PLUS) 0.5 % SOLN Place 1 drop into both eyes in the morning and at bedtime.    [provider]  cetirizine (ZYRTEC) 10 MG tablet 1 tablet    [provider]  diltiazem  (CARDIZEM  CD) 180 MG 24 hr capsule TAKE 1 CAPSULE BY MOUTH DAILY 08/14/23   Shlomo Wilbert SAUNDERS, MD  estradiol (ESTRACE) 0.1 MG/GM vaginal cream See admin instructions. 10/05/19   [provider]  losartan  (COZAAR ) 50 MG tablet Take 1 tablet (50 mg total) by mouth in the morning and at bedtime. 12/01/23 02/29/24  Shlomo Wilbert SAUNDERS, MD  metoprolol  succinate (TOPROL -XL) 50 MG 24 hr tablet TAKE 1 TABLET BY MOUTH DAILY 08/14/23   Shlomo Wilbert SAUNDERS, MD  Multiple Vitamin (MULTIVITAMIN) tablet Take 1 tablet by mouth daily.    [provider]    Allergies  Allergen Reactions   Ammonium-Containing Compounds Anaphylaxis, Nausea And Vomiting and Shortness Of Breath   Lidocaine Hcl Rash and Swelling   Raloxifene Rash   Macrodantin [Nitrofurantoin] Rash   Procaine Rash    Social History   Socioeconomic History   Marital status: Widowed    Spouse name: Not on file   Number of children: Not on file   Years of education: Not on file   Highest education level: Not on file  Occupational History   Not on file  Tobacco Use   Smoking status: Never   Smokeless tobacco: Never  Vaping Use   Vaping status: Never Used  Substance and Sexual Activity   Alcohol use: Yes   Drug use: Never   Sexual activity: Not on file  Other Topics Concern   Not on file  Social History Narrative   Not on file   Social Drivers of Health   Financial Resource Strain: Low Risk  (10/05/2019)   Received from [facility]   Overall Financial Resource Strain (CARDIA)    Difficulty of Paying Living Expenses: Not hard at all  Food Insecurity: No Food Insecurity (10/05/2019)   Received from [facility]   Hunger Vital Sign    Within the past 12 months, you worried that your food would run out before you got the money  to buy more.: Never true    Within the past 12 months, the food you bought just didn't last and you didn't have money to get more.: Never true  Transportation Needs: No Transportation Needs (10/05/2019)   Received from [facility] - Transportation    Lack of Transportation (Medical): No    Lack of Transportation (Non-Medical): No  Physical Activity: Sufficiently Active (10/05/2019)   Received from [facility]   Exercise Vital Sign    On average, how many days per week do you engage in moderate to strenuous exercise (like a brisk walk)?: 3 days    On average, how many minutes do you engage in exercise at this level?: 50 min  Stress: No Stress Concern Present (10/05/2019)   Received from [facility] - Occupational Stress Questionnaire    Feeling of Stress : Only a little  Social Connections: Unknown (12/01/2021)   Received from [facility]   Social Network    Social Network: Not on file  Intimate Partner Violence: Unknown (10/23/2021)   Received from [facility]   HITS    Physically Hurt: Not on file    Insult or Talk Down To: Not on file    Threaten Physical Harm: Not on file    Scream or Curse: Not on file    Tobacco Use: Low Risk  (02/06/2024)   Patient History    Smoking Tobacco Use: Never    Smokeless Tobacco Use: Never    Passive Exposure: Not on file   Social History   Substance and Sexual Activity  Alcohol Use Yes    Family History  Problem Relation Age of Onset   Pneumonia Mother    CVA Mother    Alzheimer's disease Father    Breast cancer Sister 48   Cancer Sister        BREAST CANCER   Other Sister        EYE PROBLEMS   Arthritis Brother    Mental illness Brother     ROS   Objective:  Physical Exam: - Well-developed female, alert, oriented, no apparent distress. - Right hip: Flexed to 100 degrees with no internal or external rotation, only about 10 degrees of abduction. Slightly tender over  the greater trochanter. - Left hip: Flexion to 110 degrees, rotation in 20 degrees, out 30 degrees, abduction 30 degrees without pain or range of motion. Very tender over the left greater trochanter.  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 - Radiographs of the left hip demonstrate a prosthesis in good position with significant anteversion of the acetabular component but no periprosthetic abnormalities.  Assessment/Plan:  End stage arthritis,  right hip  The patient history, physical examination, clinical judgement of the provider and imaging studies are consistent with end stage degenerative joint disease of the right hip and total hip arthroplasty is deemed medically necessary. The treatment options including medical management, injection therapy, arthroscopy and arthroplasty were discussed at length. The risks and benefits of total hip arthroplasty were presented and reviewed. The risks due to aseptic loosening, infection, stiffness, dislocation/subluxation, thromboembolic complications and other imponderables were discussed. The patient acknowledged the explanation, agreed to proceed with the plan and consent was signed. Patient is being admitted for inpatient treatment for surgery, pain control, PT, OT, prophylactic antibiotics, VTE prophylaxis, progressive ambulation and ADLs and discharge planning.The patient is planning to be discharged home.   Patient's anticipated LOS is less than 2 midnights, meeting these requirements: - Younger than 36 - Lives within 1 hour of care - Has a competent adult at home to recover with post-op recover - NO history of  - Chronic pain requiring opiods  - Diabetes  - Coronary Artery Disease  - Heart failure  - Heart attack  - Stroke  - DVT/VTE  - Cardiac arrhythmia  - Respiratory Failure/COPD  - Renal  failure  - Anemia  - Advanced Liver disease  Therapy Plans: HEP Disposition: Home with daughters-in-law for about 5 days Planned DVT Prophylaxis: Aspirin 81mg  BID  DME Needed: None PCP: Olam Pinal MD (clearance received) Cardiologist: Wilbert Bihari MD (clearance received) TXA: IV Allergies: ammonium, cortisone (nausea, HA, tremors), levofloxacin (rash, nausea), lidocaine (swelling, redness), nitrofurantoin (rash), procaine (rash), raloxifene (rash), iron (nausea) Anesthesia Concerns: Nausea  BMI: 20.6 Last HgbA1c: Not diabetic  Pharmacy: [facility] [address]  Other: -Reports hgb <8 after 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   - Patient was instructed on what medications to stop prior to surgery. - Follow-up visit in 2 weeks with Dr. Melodi - Begin physical therapy following surgery - Pre-operative lab work as pre-surgical testing - Prescriptions will be provided in hospital at time of discharge  Corean Sender, PA-C Orthopedic Surgery [facility] [place]

## 2024-03-09 NOTE — Progress Notes (Signed)
  Date of COVID positive in last 90 days:  No  PCP - Olam Pinal, MD (office note requested) Cardiologist - Wilbert Bihari, MD  Cardiac clearance in Epic dated 02-06-24  Chest x-ray - N/A EKG - 08-19-23 Epic Stress Test - Yes, chemical ECHO - 06-18-21 Epic Coronary CT - 09-12-22 Epic Cardiac Cath - N/A Pacemaker/ICD device last checked:N/A Spinal Cord Stimulator:N/A  Bowel Prep - N/A  Sleep Study - N/A CPAP -   Fasting Blood Sugar - N/A Checks Blood Sugar _____ times a day  Last dose of GLP1 agonist-  N/A GLP1 instructions:  Do not take after     Last dose of SGLT-2 inhibitors-  N/A SGLT-2 instructions:  Do not take after    Blood Thinner Instructions: N/A Aspirin Instructions:N/A Last Dose:  Activity level:  Unable to climb stairs due to hip pain.  Able to perform activities of daily living without stopping and without symptoms of chest pain or shortness of breath.  Anesthesia review: Mitral regurg, aortic insuffficiency, HTN  Patient denies shortness of breath, fever, cough and chest pain at PAT appointment  Patient verbalized understanding of instructions that were given to them at the PAT appointment. Patient was also instructed that they will need to review over the PAT instructions again at home before surgery.

## 2024-03-09 NOTE — Patient Instructions (Addendum)
 SURGICAL WAITING ROOM VISITATION Patients having surgery or a procedure may have no more than 2 support people in the waiting area - these visitors may rotate.    Children under the age of 52 must have an adult with them who is not the patient.  If the patient needs to stay at the hospital during part of their recovery, the visitor guidelines for inpatient rooms apply. Pre-op nurse will coordinate an appropriate time for 1 support person to accompany patient in pre-op.  This support person may not rotate.    Please refer to the [facility] website for the visitor guidelines for Inpatients (after your surgery is over and you are in a regular room).       Your procedure is scheduled on: 03-24-24   Report to [facility] Main Entrance    Report to admitting at 7:15 AM   Call this number if you have problems the morning of surgery [phone]   Do not eat food :After Midnight.   After Midnight you may have the following liquids until 6:45 AM DAY OF SURGERY  Water Non-Citrus Juices (without pulp, NO RED-Apple, White grape, White cranberry) Black Coffee (NO MILK/CREAM OR CREAMERS, sugar ok)  Clear Tea (NO MILK/CREAM OR CREAMERS, sugar ok) regular and decaf                             Plain Jell-O (NO RED)                                           Fruit ices (not with fruit pulp, NO RED)                                     Popsicles (NO RED)                                                               Sports drinks like Gatorade (NO RED)                   The day of surgery:  Drink ONE (1) Pre-Surgery Clear Ensure by 6:45 AM the morning of surgery. Drink in one sitting. Do not sip.  This drink was given to you during your hospital  pre-op appointment visit. Nothing else to drink after completing the Pre-Surgery Clear Ensure.          If you have questions, please contact your surgeon's office.   FOLLOW ANY ADDITIONAL PRE OP INSTRUCTIONS YOU RECEIVED FROM YOUR SURGEON'S  OFFICE!!!     Oral Hygiene is also important to reduce your risk of infection.                                    Remember - BRUSH YOUR TEETH THE MORNING OF SURGERY WITH YOUR REGULAR TOOTHPASTE   Do NOT smoke after Midnight   Take these medicines the morning of surgery with A SIP OF WATER:    Diltiazem    Metoprolol   Zyrtec  Stop all vitamins and herbal supplements 7 days before surgery  Bring CPAP mask and tubing day of surgery.                              You may not have any metal on your body including hair pins, jewelry, and body piercing             Do not wear make-up, lotions, powders, perfumes, or deodorant  Do not wear nail polish including gel and S&S, artificial/acrylic nails, or any other type of covering on natural nails including finger and toenails. If you have artificial nails, gel coating, etc. that needs to be removed by a nail salon please have this removed prior to surgery or surgery may need to be canceled/ delayed if the surgeon/ anesthesia feels like they are unable to be safely monitored.   Do not shave  48 hours prior to surgery.            Do not bring valuables to the hospital. [facility] IS NOT RESPONSIBLE   FOR VALUABLES.   Contacts, dentures or bridgework may not be worn into surgery.   Bring small overnight bag day of surgery.   DO NOT BRING YOUR HOME MEDICATIONS TO THE HOSPITAL. PHARMACY WILL DISPENSE MEDICATIONS LISTED ON YOUR MEDICATION LIST TO YOU DURING YOUR ADMISSION IN THE HOSPITAL!    Special Instructions: Bring a copy of your healthcare power of attorney and living will documents the day of surgery if you haven't scanned them before.              Please read over the following fact sheets you were given: IF YOU HAVE QUESTIONS ABOUT YOUR PRE-OP INSTRUCTIONS PLEASE CALL [phone] Gwen  If you received a COVID test during your pre-op visit  it is requested that you wear a mask when out in public, stay away from anyone that may not be  feeling well and notify your surgeon if you develop symptoms. If you test positive for Covid or have been in contact with anyone that has tested positive in the last 10 days please notify you surgeon.   Pre-operative 5 CHG Bath Instructions   You can play a key role in reducing the risk of infection after surgery. Your skin needs to be as free of germs as possible. You can reduce the number of germs on your skin by washing with CHG (chlorhexidine gluconate) soap before surgery. CHG is an antiseptic soap that kills germs and continues to kill germs even after washing.   DO NOT use if you have an allergy to chlorhexidine/CHG or antibacterial soaps. If your skin becomes reddened or irritated, stop using the CHG and notify one of our RNs at [phone].   Please shower with the CHG soap starting 4 days before surgery using the following schedule:     Please keep in mind the following:  DO NOT shave, including legs and underarms, starting the day of your first shower.   You may shave your face at any point before/day of surgery.  Place clean sheets on your bed the day you start using CHG soap. Use a clean washcloth (not used since being washed) for each shower. DO NOT sleep with pets once you start using the CHG.   CHG Shower Instructions:  If you choose to wash your hair and private area, wash first with your normal shampoo/soap.  After you use shampoo/soap, rinse your  hair and body thoroughly to remove shampoo/soap residue.  Turn the water OFF and apply about 3 tablespoons (45 ml) of CHG soap to a CLEAN washcloth.  Apply CHG soap ONLY FROM YOUR NECK DOWN TO YOUR TOES (washing for 3-5 minutes)  DO NOT use CHG soap on face, private areas, open wounds, or sores.  Pay special attention to the area where your surgery is being performed.  If you are having back surgery, having someone wash your back for you may be helpful. Wait 2 minutes after CHG soap is applied, then you may rinse off the CHG  soap.  Pat dry with a clean towel  Put on clean clothes/pajamas   If you choose to wear lotion, please use ONLY the CHG-compatible lotions on the back of this paper.     Additional instructions for the day of surgery: DO NOT APPLY any lotions, deodorants, cologne, or perfumes.   Put on clean/comfortable clothes.  Brush your teeth.  Ask your nurse before applying any prescription medications to the skin.      CHG Compatible Lotions   Aveeno Moisturizing lotion  Cetaphil Moisturizing Cream  Cetaphil Moisturizing Lotion  Clairol Herbal Essence Moisturizing Lotion, Dry Skin  Clairol Herbal Essence Moisturizing Lotion, Extra Dry Skin  Clairol Herbal Essence Moisturizing Lotion, Normal Skin  Curel Age Defying Therapeutic Moisturizing Lotion with Alpha Hydroxy  Curel Extreme Care Body Lotion  Curel Soothing Hands Moisturizing Hand Lotion  Curel Therapeutic Moisturizing Cream, Fragrance-Free  Curel Therapeutic Moisturizing Lotion, Fragrance-Free  Curel Therapeutic Moisturizing Lotion, Original Formula  Eucerin Daily Replenishing Lotion  Eucerin Dry Skin Therapy Plus Alpha Hydroxy Crme  Eucerin Dry Skin Therapy Plus Alpha Hydroxy Lotion  Eucerin Original Crme  Eucerin Original Lotion  Eucerin Plus Crme Eucerin Plus Lotion  Eucerin TriLipid Replenishing Lotion  Keri Anti-Bacterial Hand Lotion  Keri Deep Conditioning Original Lotion Dry Skin Formula Softly Scented  Keri Deep Conditioning Original Lotion, Fragrance Free Sensitive Skin Formula  Keri Lotion Fast Absorbing Fragrance Free Sensitive Skin Formula  Keri Lotion Fast Absorbing Softly Scented Dry Skin Formula  Keri Original Lotion  Keri Skin Renewal Lotion Keri Silky Smooth Lotion  Keri Silky Smooth Sensitive Skin Lotion  Nivea Body Creamy Conditioning Oil  Nivea Body Extra Enriched Lotion  Nivea Body Original Lotion  Nivea Body Sheer Moisturizing Lotion Nivea Crme  Nivea Skin Firming Lotion  NutraDerm 30 Skin Lotion   NutraDerm Skin Lotion  NutraDerm Therapeutic Skin Cream  NutraDerm Therapeutic Skin Lotion  ProShield Protective Hand Cream  Provon moisturizing lotion   PATIENT SIGNATURE_________________________________  NURSE SIGNATURE__________________________________  ________________________________________________________________________    Vanessa Bonilla  An incentive spirometer is a tool that can help keep your lungs clear and active. This tool measures how well you are filling your lungs with each breath. Taking long deep breaths may help reverse or decrease the chance of developing breathing (pulmonary) problems (especially infection) following: A long period of time when you are unable to move or be active. BEFORE THE PROCEDURE  If the spirometer includes an indicator to show your best effort, your nurse or respiratory therapist will set it to a desired goal. If possible, sit up straight or lean slightly forward. Try not to slouch. Hold the incentive spirometer in an upright position. INSTRUCTIONS FOR USE  Sit on the edge of your bed if possible, or sit up as far as you can in bed or on a chair. Hold the incentive spirometer in an upright position. Breathe out normally. Place the mouthpiece  in your mouth and seal your lips tightly around it. Breathe in slowly and as deeply as possible, raising the piston or the ball toward the top of the column. Hold your breath for 3-5 seconds or for as long as possible. Allow the piston or ball to fall to the bottom of the column. Remove the mouthpiece from your mouth and breathe out normally. Rest for a few seconds and repeat Steps 1 through 7 at least 10 times every 1-2 hours when you are awake. Take your time and take a few normal breaths between deep breaths. The spirometer may include an indicator to show your best effort. Use the indicator as a goal to work toward during each repetition. After each set of 10 deep breaths, practice coughing to  be sure your lungs are clear. If you have an incision (the cut made at the time of surgery), support your incision when coughing by placing a pillow or rolled up towels firmly against it. Once you are able to get out of bed, walk around indoors and cough well. You may stop using the incentive spirometer when instructed by your caregiver.  RISKS AND COMPLICATIONS Take your time so you do not get dizzy or light-headed. If you are in pain, you may need to take or ask for pain medication before doing incentive spirometry. It is harder to take a deep breath if you are having pain. AFTER USE Rest and breathe slowly and easily. It can be helpful to keep track of a log of your progress. Your caregiver can provide you with a simple table to help with this. If you are using the spirometer at home, follow these instructions: SEEK MEDICAL CARE IF:  You are having difficultly using the spirometer. You have trouble using the spirometer as often as instructed. Your pain medication is not giving enough relief while using the spirometer. You develop fever of 100.5 F (38.1 C) or higher. SEEK IMMEDIATE MEDICAL CARE IF:  You cough up bloody sputum that had not been present before. You develop fever of 102 F (38.9 C) or greater. You develop worsening pain at or near the incision site. MAKE SURE YOU:  Understand these instructions. Will watch your condition. Will get help right away if you are not doing well or get worse. Document Released: 11/18/2006 Document Revised: 09/30/2011 Document Reviewed: 01/19/2007 Exit[employer] Patient Information 2014 [employer], [place].   ________________________________________________________________________ WHAT IS A BLOOD TRANSFUSION? Blood Transfusion Information  A transfusion is the replacement of blood or some of its parts. Blood is made up of multiple cells which provide different functions. Red blood cells carry oxygen and are used for blood loss replacement. White blood  cells fight against infection. Platelets control bleeding. Plasma helps clot blood. Other blood products are available for specialized needs, such as hemophilia or other clotting disorders. BEFORE THE TRANSFUSION  Who gives blood for transfusions?  Healthy volunteers who are fully evaluated to make sure their blood is safe. This is blood bank blood. Transfusion therapy is the safest it has ever been in the practice of medicine. Before blood is taken from a donor, a complete history is taken to make sure that person has no history of diseases nor engages in risky social behavior (examples are intravenous drug use or sexual activity with multiple partners). The donor's travel history is screened to minimize risk of transmitting infections, such as malaria. The donated blood is tested for signs of infectious diseases, such as HIV and hepatitis. The blood is then tested  to be sure it is compatible with you in order to minimize the chance of a transfusion reaction. If you or a relative donates blood, this is often done in anticipation of surgery and is not appropriate for emergency situations. It takes many days to process the donated blood. RISKS AND COMPLICATIONS Although transfusion therapy is very safe and saves many lives, the main dangers of transfusion include:  Getting an infectious disease. Developing a transfusion reaction. This is an allergic reaction to something in the blood you were given. Every precaution is taken to prevent this. The decision to have a blood transfusion has been considered carefully by your caregiver before blood is given. Blood is not given unless the benefits outweigh the risks. AFTER THE TRANSFUSION Right after receiving a blood transfusion, you will usually feel much better and more energetic. This is especially true if your red blood cells have gotten low (anemic). The transfusion raises the level of the red blood cells which carry oxygen, and this usually causes an  energy increase. The nurse administering the transfusion will monitor you carefully for complications. HOME CARE INSTRUCTIONS  No special instructions are needed after a transfusion. You may find your energy is better. Speak with your caregiver about any limitations on activity for underlying diseases you may have. SEEK MEDICAL CARE IF:  Your condition is not improving after your transfusion. You develop redness or irritation at the intravenous (IV) site. SEEK IMMEDIATE MEDICAL CARE IF:  Any of the following symptoms occur over the next 12 hours: Shaking chills. You have a temperature by mouth above 102 F (38.9 C), not controlled by medicine. Chest, back, or muscle pain. People around you feel you are not acting correctly or are confused. Shortness of breath or difficulty breathing. Dizziness and fainting. You get a rash or develop hives. You have a decrease in urine output. Your urine turns a dark color or changes to pink, red, or brown. Any of the following symptoms occur over the next 10 days: You have a temperature by mouth above 102 F (38.9 C), not controlled by medicine. Shortness of breath. Weakness after normal activity. The white part of the eye turns yellow (jaundice). You have a decrease in the amount of urine or are urinating less often. Your urine turns a dark color or changes to pink, red, or brown. Document Released: 07/05/2000 Document Revised: 09/30/2011 Document Reviewed: 02/22/2008 [facility] Patient Information 2014 [place], [place].  _______________________________________________________________________

## 2024-03-11 ENCOUNTER — Other Ambulatory Visit: Payer: Self-pay

## 2024-03-11 ENCOUNTER — Encounter (HOSPITAL_COMMUNITY)
Admission: RE | Admit: 2024-03-11 | Discharge: 2024-03-11 | Disposition: A | Discharge status: Home / Self Care | Source: Ambulatory Visit | Attending: Orthopedic Surgery | Admitting: Orthopedic Surgery

## 2024-03-11 ENCOUNTER — Encounter (HOSPITAL_COMMUNITY): Payer: Self-pay

## 2024-03-11 VITALS — BP 147/83 | HR 53 | Temp 99.1°F | Resp 12 | Ht 59.5 in | Wt 104.0 lb

## 2024-03-11 DIAGNOSIS — Z01812 Encounter for preprocedural laboratory examination: Secondary | ICD-10-CM | POA: Diagnosis present

## 2024-03-11 DIAGNOSIS — I10 Essential (primary) hypertension: Secondary | ICD-10-CM | POA: Insufficient documentation

## 2024-03-11 DIAGNOSIS — Z01818 Encounter for other preprocedural examination: Secondary | ICD-10-CM

## 2024-03-11 HISTORY — DX: Other specified postprocedural states: Z98.890

## 2024-03-11 HISTORY — DX: Other specified postprocedural states: R11.2

## 2024-03-11 LAB — BASIC METABOLIC PANEL WITH GFR
Anion gap: 9 (ref 5–15)
BUN: 14 mg/dL (ref 8–23)
CO2: 26 mmol/L (ref 22–32)
Calcium: 8.9 mg/dL (ref 8.9–10.3)
Chloride: 105 mmol/L (ref 98–111)
Creatinine, Ser: 0.59 mg/dL (ref 0.44–1.00)
GFR, Estimated: >60 mL/min (ref >60)
Glucose, Bld: 98 mg/dL (ref 70–99)
Potassium: 3.8 mmol/L (ref 3.5–5.1)
Sodium: 140 mmol/L (ref 135–145)

## 2024-03-11 LAB — TYPE AND SCREEN
ABO/RH(D): A POS
Antibody Screen: NEGATIVE

## 2024-03-11 LAB — CBC
HCT: 41.2 % (ref 36.0–46.0)
Hemoglobin: 13.2 g/dL (ref 12.0–15.0)
MCH: 29.7 pg (ref 26.0–34.0)
MCHC: 32 g/dL (ref 30.0–36.0)
MCV: 92.8 fL (ref 80.0–100.0)
Platelets: 304 K/uL (ref 150–400)
RBC: 4.44 MIL/uL (ref 3.87–5.11)
RDW: 13.2 % (ref 11.5–15.5)
WBC: 8 K/uL (ref 4.0–10.5)
nRBC: 0 % (ref 0.0–0.2)

## 2024-03-11 LAB — SURGICAL PCR SCREEN
MRSA, PCR: NEGATIVE
Staphylococcus aureus: NEGATIVE

## 2024-03-16 ENCOUNTER — Encounter (HOSPITAL_COMMUNITY): Payer: Self-pay

## 2024-03-16 NOTE — Progress Notes (Incomplete)
 Case: [id number] Date/Time: 03/24/24 0930   Procedure: ARTHROPLASTY, HIP, TOTAL, ANTERIOR APPROACH (Right: Hip)   Anesthesia type: Choice   Pre-op diagnosis: Right hip osteoarthritis   Location: [facility] ROOM 09 / [facility]   Surgeons: Melodi Lerner, MD       DISCUSSION: Vanessa Bonilla is a 78 yo female with PMH of HTN, palpitations, arthritis.  Patient follows with Cardiology for hx of HTN, palpitations. Coronary CTA in 2024 showed no CAD. Echo in 2022 showed normal LVEF with mild MR and mild AI. Last seen in clinic by Dr. Shlomo on 08/19/23. She denied cardiac symptoms. Had tele visit for cardiac clearance on 02/06/24 and was cleared:  Preoperative Cardiovascular Risk Assessment: According to the Revised Cardiac Risk Index (RCRI), her Perioperative Risk of Major Cardiac Event is (%): 0.4. Her Functional Capacity in METs is: 5.07 according to the [facility] Activity Status Index (DASI). Therefore, based on ACC/AHA guidelines, patient would be at acceptable risk for the planned procedure without further cardiovascular testing. I will route this recommendation to the requesting party via Epic fax function.  Seen by PCP on 01/06/24 for medical clearance. She reported fatigue, weakness, headache, R leg weakness and L arm tremor. MRI brain ordered and showed an enhancing extra-axial mass along the floor of the anterior cranial fossa most consistent with a meningioma and a nonspecific mass in the left sphenoid. ENT referral recommended.  VS: BP (!) 147/83   Pulse (!) 53   Temp 37.3 C (Oral)   Resp 12   Ht 4' 11.5 (1.511 m)   Wt 47.2 kg   SpO2 98%   BMI 20.65 kg/m   PROVIDERS: Cleotilde Planas, MD   LABS: {CHL AN LABS REVIEWED:112001::Labs reviewed: Acceptable for surgery.} (all labs ordered are listed, but only abnormal results are displayed)  Labs Reviewed  SURGICAL PCR SCREEN  BASIC METABOLIC PANEL WITH GFR  CBC  TYPE AND SCREEN     IMAGES:  MRI Brain 02/03/24:  IMPRESSION: 1. No  evidence of an acute or recent subacute infarction. 2. 2.0 x 1.7 cm  Indentation of the adjacent brain parenchyma without parenchymal edema. 3. Moderate chronic small vessel ischemic changes within the cerebral white matter and pons. 4. Mild generalized cerebral atrophy. 5. Nonspecific 1.6 x 1.3 cm enhancing mass within the left sphenoid sinus. ENT referral recommended.  EKG:   CV:  Past Medical History:  Diagnosis Date   Aortic regurgitation    Mild by echo 2022   DJD (degenerative joint disease), cervical    Hyperlipidemia    Hypertension    Left hip pain    Menopause    Mitral regurgitation    Mild by echo 2022   PAC (premature atrial contraction)    PONV (postoperative nausea and vomiting)    Primary osteoarthritis involving multiple joints    Recurrent UTI     Past Surgical History:  Procedure Laterality Date   DG SCOLIOSIS SERIES ([facility] HX)     REPLACEMENT TOTAL HIP W/  RESURFACING IMPLANTS     TONSILLECTOMY     TUBAL LIGATION      MEDICATIONS:  calcium carbonate (CALCIUM ANTACID EXTRA STRENGTH) 750 MG chewable tablet   Carboxymethylcellulose Sod PF (REFRESH PLUS) 0.5 % SOLN   cetirizine (ZYRTEC) 10 MG tablet   cholecalciferol (VITAMIN D3) 25 MCG (1000 UNIT) tablet   Cranberry 500 MG TABS   diclofenac (VOLTAREN) 75 MG EC tablet   DILT-XR 180 MG 24 hr capsule   estradiol (ESTRACE) 0.1 MG/GM vaginal cream  losartan  (COZAAR ) 50 MG tablet   metoprolol  succinate (TOPROL -XL) 50 MG 24 hr tablet   Multiple Vitamin (MULTIVITAMIN) tablet   No current facility-administered medications for this encounter.

## 2024-03-17 ENCOUNTER — Encounter (HOSPITAL_COMMUNITY): Payer: Self-pay

## 2024-03-17 NOTE — Anesthesia Preprocedure Evaluation (Addendum)
 Anesthesia Evaluation  Patient identified by MRN, date of birth, ID band Patient awake    Reviewed: Allergy & Precautions, NPO status , Patient's Chart, lab work & pertinent test results  History of Anesthesia Complications (+) PONV and history of anesthetic complications  Airway Mallampati: III  TM Distance: >3 FB Neck ROM: Full    Dental  (+) Dental Advisory Given   Pulmonary neg pulmonary ROS   breath sounds clear to auscultation       Cardiovascular hypertension (losartan , metoprolol ), Pt. on medications and Pt. on home beta blockers (-) angina (-) Past MI, (-) Cardiac Stents and (-) CABG + dysrhythmias (PACs) + Valvular Problems/Murmurs (mild) MR and AI  Rhythm:Regular Rate:Normal  HLD  CAC 0 on 09/12/2022  TTE 06/18/2021: IMPRESSIONS    1. Left ventricular ejection fraction, by estimation, is 60 to 65%. The  left ventricle has normal function. The left ventricle has no regional  wall motion abnormalities. There is mild asymmetric left ventricular  hypertrophy of the basal-septal segment.  Left ventricular diastolic parameters were normal. The average left  ventricular global longitudinal strain is -23.5 %.   2. Right ventricular systolic function is normal. The right ventricular  size is normal. There is normal pulmonary artery systolic pressure. The  estimated right ventricular systolic pressure is 29.6 mmHg.   3. A small pericardial effusion is present. The pericardial effusion is  anterior to the right ventricle.   4. The mitral valve is degenerative. Mild mitral valve regurgitation. No  evidence of mitral stenosis. Moderate mitral annular calcification.   5. The aortic valve is tricuspid. Aortic valve regurgitation is mild. No  aortic stenosis is present.   6. The inferior vena cava is normal in size with greater than 50%  respiratory variability, suggesting right atrial pressure of 3 mmHg.     Neuro/Psych neg  Seizures Brain MRI 02/03/2024: IMPRESSION: 1. No evidence of an acute or recent subacute infarction. 2. 2.0 x 1.7 cm enhancing extra-axial mass along the floor of the anterior cranial fossa most consistent with a meningioma. Indentation of the adjacent brain parenchyma without parenchymal edema. 3. Moderate chronic small vessel ischemic changes within the cerebral white matter and pons. 4. Mild generalized cerebral atrophy. 5. Nonspecific 1.6 x 1.3 cm enhancing mass within the left sphenoid sinus. ENT referral recommended.     GI/Hepatic negative GI ROS, Neg liver ROS,,,  Endo/Other  negative endocrine ROS    Renal/GU negative Renal ROS     Musculoskeletal  (+) Arthritis , Osteoarthritis,  scoliosis s/p thoracolumbar fusion (Right Harrington rod and noninstrumented posterior fusion spanning T9-L5)   Abdominal   Peds  Hematology negative hematology ROS (+) Lab Results      Component                Value               Date                      WBC                      8.0                 03/11/2024                HGB                      13.2  03/11/2024                HCT                      41.2                03/11/2024                MCV                      92.8                03/11/2024                PLT                      304                 03/11/2024              Anesthesia Other Findings Lidocaine  allergy - hives  Reproductive/Obstetrics                              Anesthesia Physical Anesthesia Plan  ASA: 3  Anesthesia Plan: General   Post-op Pain Management: Tylenol  PO (pre-op)*   Induction: Intravenous  PONV Risk Score and Plan: 4 or greater and Ondansetron , Dexamethasone , Propofol  infusion, TIVA and Treatment may vary due to age or medical condition  Airway Management Planned: Oral ETT  Additional Equipment:   Intra-op Plan:   Post-operative Plan: Extubation in OR  Informed Consent: I have reviewed  the patients History and Physical, chart, labs and discussed the procedure including the risks, benefits and alternatives for the proposed anesthesia with the patient or authorized representative who has indicated his/her understanding and acceptance.     Dental advisory given  Plan Discussed with: CRNA and Anesthesiologist  Anesthesia Plan Comments: (See PAT note from 8/21  Risks of general anesthesia discussed including, but not limited to, sore throat, hoarse voice, chipped/damaged teeth, injury to vocal cords, nausea and vomiting, allergic reactions, lung infection, heart attack, stroke, and death. All questions answered.  )         Anesthesia Quick Evaluation

## 2024-03-24 ENCOUNTER — Ambulatory Visit (HOSPITAL_COMMUNITY): Payer: Self-pay | Admitting: Medical

## 2024-03-24 ENCOUNTER — Ambulatory Visit (HOSPITAL_COMMUNITY)

## 2024-03-24 ENCOUNTER — Observation Stay (HOSPITAL_COMMUNITY)

## 2024-03-24 ENCOUNTER — Observation Stay (HOSPITAL_COMMUNITY)
Admission: RE | Admit: 2024-03-24 | Discharge: 2024-03-25 | Disposition: A | Discharge status: Home / Self Care | Source: Ambulatory Visit | Attending: Orthopedic Surgery | Admitting: Orthopedic Surgery

## 2024-03-24 ENCOUNTER — Ambulatory Visit (HOSPITAL_COMMUNITY): Payer: Self-pay | Admitting: Anesthesiology

## 2024-03-24 ENCOUNTER — Other Ambulatory Visit: Payer: Self-pay

## 2024-03-24 ENCOUNTER — Encounter (HOSPITAL_COMMUNITY)
Admission: RE | Disposition: A | Discharge status: Home / Self Care | Payer: Self-pay | Source: Ambulatory Visit | Attending: Orthopedic Surgery

## 2024-03-24 ENCOUNTER — Encounter (HOSPITAL_COMMUNITY): Payer: Self-pay | Admitting: Orthopedic Surgery

## 2024-03-24 DIAGNOSIS — Z7982 Long term (current) use of aspirin: Secondary | ICD-10-CM | POA: Insufficient documentation

## 2024-03-24 DIAGNOSIS — F1090 Alcohol use, unspecified, uncomplicated: Secondary | ICD-10-CM | POA: Insufficient documentation

## 2024-03-24 DIAGNOSIS — I10 Essential (primary) hypertension: Secondary | ICD-10-CM | POA: Diagnosis not present

## 2024-03-24 DIAGNOSIS — I493 Ventricular premature depolarization: Secondary | ICD-10-CM | POA: Diagnosis not present

## 2024-03-24 DIAGNOSIS — M1611 Unilateral primary osteoarthritis, right hip: Secondary | ICD-10-CM

## 2024-03-24 DIAGNOSIS — I080 Rheumatic disorders of both mitral and aortic valves: Secondary | ICD-10-CM | POA: Diagnosis not present

## 2024-03-24 DIAGNOSIS — M169 Osteoarthritis of hip, unspecified: Principal | ICD-10-CM | POA: Diagnosis present

## 2024-03-24 HISTORY — PX: TOTAL HIP ARTHROPLASTY: SHX124

## 2024-03-24 LAB — ABO/RH: ABO/RH(D): A POS

## 2024-03-24 LAB — GLUCOSE, CAPILLARY: Glucose-Capillary: 199 mg/dL — ABNORMAL HIGH (ref 70–99)

## 2024-03-24 SURGERY — ARTHROPLASTY, HIP, TOTAL, ANTERIOR APPROACH
Anesthesia: General | Site: Hip | Laterality: Right

## 2024-03-24 MED ORDER — DILTIAZEM HCL ER 180 MG PO CP24: 180.0000 mg | ORAL_CAPSULE | Freq: Every day | ORAL | Status: DC

## 2024-03-24 MED ORDER — FENTANYL CITRATE (PF) 250 MCG/5ML IJ SOLN
INTRAMUSCULAR | Status: AC
  Filled 2024-03-24: qty 5

## 2024-03-24 MED ORDER — DILTIAZEM HCL ER COATED BEADS 180 MG PO CP24
180.0000 mg | ORAL_CAPSULE | Freq: Every day | ORAL | Status: DC
  Administered 2024-03-25: 180 mg via ORAL
  Filled 2024-03-24: qty 1

## 2024-03-24 MED ORDER — PROPOFOL 10 MG/ML IV BOLUS
INTRAVENOUS | Status: AC
  Filled 2024-03-24: qty 20

## 2024-03-24 MED ORDER — CEFAZOLIN SODIUM-DEXTROSE 2-4 GM/100ML-% IV SOLN
2.0000 g | Freq: Four times a day (QID) | INTRAVENOUS | Status: AC
  Administered 2024-03-24 (×2): 2 g via INTRAVENOUS
  Filled 2024-03-24 (×2): qty 100

## 2024-03-24 MED ORDER — TRANEXAMIC ACID-NACL 1000-0.7 MG/100ML-% IV SOLN
1000.0000 mg | INTRAVENOUS | Status: AC
  Administered 2024-03-24: 1000 mg via INTRAVENOUS
  Filled 2024-03-24: qty 100

## 2024-03-24 MED ORDER — ROCURONIUM BROMIDE 10 MG/ML (PF) SYRINGE
PREFILLED_SYRINGE | INTRAVENOUS | Status: AC
  Filled 2024-03-24: qty 10

## 2024-03-24 MED ORDER — WATER FOR IRRIGATION, STERILE IR SOLN
Status: DC | PRN
  Administered 2024-03-24: 2000 mL

## 2024-03-24 MED ORDER — METHOCARBAMOL 1000 MG/10ML IJ SOLN: 500.0000 mg | Freq: Four times a day (QID) | INTRAMUSCULAR | Status: DC | PRN

## 2024-03-24 MED ORDER — MENTHOL 3 MG MT LOZG: 1.0000 | LOZENGE | OROMUCOSAL | Status: DC | PRN

## 2024-03-24 MED ORDER — DEXAMETHASONE SODIUM PHOSPHATE 10 MG/ML IJ SOLN
8.0000 mg | Freq: Once | INTRAMUSCULAR | Status: AC
  Administered 2024-03-24: 8 mg via INTRAVENOUS

## 2024-03-24 MED ORDER — ACETAMINOPHEN 10 MG/ML IV SOLN
1000.0000 mg | Freq: Four times a day (QID) | INTRAVENOUS | Status: AC
  Administered 2024-03-24: 1000 mg via INTRAVENOUS
  Filled 2024-03-24: qty 100

## 2024-03-24 MED ORDER — EPHEDRINE 5 MG/ML INJ
INTRAVENOUS | Status: AC
  Filled 2024-03-24: qty 5

## 2024-03-24 MED ORDER — BISACODYL 10 MG RE SUPP: 10.0000 mg | Freq: Every day | RECTAL | Status: DC | PRN

## 2024-03-24 MED ORDER — ORAL CARE MOUTH RINSE: 15.0000 mL | Freq: Once | OROMUCOSAL | Status: AC

## 2024-03-24 MED ORDER — PROPOFOL 1000 MG/100ML IV EMUL
INTRAVENOUS | Status: AC
  Filled 2024-03-24: qty 100

## 2024-03-24 MED ORDER — POVIDONE-IODINE 10 % EX SWAB: 2.0000 | Freq: Once | CUTANEOUS | Status: DC

## 2024-03-24 MED ORDER — ACETAMINOPHEN 325 MG PO TABS: 325.0000 mg | ORAL_TABLET | Freq: Four times a day (QID) | ORAL | Status: DC | PRN

## 2024-03-24 MED ORDER — PROPOFOL 10 MG/ML IV BOLUS
INTRAVENOUS | Status: DC | PRN
  Administered 2024-03-24: 60 mg via INTRAVENOUS
  Administered 2024-03-24: 20 mg via INTRAVENOUS

## 2024-03-24 MED ORDER — ONDANSETRON HCL 4 MG PO TABS: 4.0000 mg | ORAL_TABLET | Freq: Four times a day (QID) | ORAL | Status: DC | PRN

## 2024-03-24 MED ORDER — LIDOCAINE HCL (PF) 2 % IJ SOLN
INTRAMUSCULAR | Status: AC
Start: 2024-03-24 — End: 2024-03-24
  Filled 2024-03-24: qty 5

## 2024-03-24 MED ORDER — DEXAMETHASONE SODIUM PHOSPHATE 10 MG/ML IJ SOLN
INTRAMUSCULAR | Status: AC
  Filled 2024-03-24: qty 1

## 2024-03-24 MED ORDER — METOCLOPRAMIDE HCL 5 MG/ML IJ SOLN: 5.0000 mg | Freq: Three times a day (TID) | INTRAMUSCULAR | Status: DC | PRN

## 2024-03-24 MED ORDER — ONDANSETRON HCL 4 MG/2ML IJ SOLN
INTRAMUSCULAR | Status: DC | PRN
  Administered 2024-03-24: 4 mg via INTRAVENOUS

## 2024-03-24 MED ORDER — ONDANSETRON HCL 4 MG/2ML IJ SOLN
INTRAMUSCULAR | Status: AC
  Filled 2024-03-24: qty 2

## 2024-03-24 MED ORDER — DEXAMETHASONE SODIUM PHOSPHATE 10 MG/ML IJ SOLN
10.0000 mg | Freq: Once | INTRAMUSCULAR | Status: AC
  Administered 2024-03-25: 10 mg via INTRAVENOUS
  Filled 2024-03-24: qty 1

## 2024-03-24 MED ORDER — HYDROCODONE-ACETAMINOPHEN 5-325 MG PO TABS: 1.0000 | ORAL_TABLET | ORAL | Status: DC | PRN

## 2024-03-24 MED ORDER — BUPIVACAINE-EPINEPHRINE (PF) 0.25% -1:200000 IJ SOLN
INTRAMUSCULAR | Status: AC
  Filled 2024-03-24: qty 30

## 2024-03-24 MED ORDER — METHOCARBAMOL 500 MG PO TABS: 500.0000 mg | ORAL_TABLET | Freq: Four times a day (QID) | ORAL | Status: DC | PRN

## 2024-03-24 MED ORDER — FENTANYL CITRATE PF 50 MCG/ML IJ SOSY: 25.0000 ug | PREFILLED_SYRINGE | INTRAMUSCULAR | Status: DC | PRN

## 2024-03-24 MED ORDER — 0.9 % SODIUM CHLORIDE (POUR BTL) OPTIME
TOPICAL | Status: DC | PRN
  Administered 2024-03-24: 1000 mL

## 2024-03-24 MED ORDER — OXYCODONE HCL 5 MG/5ML PO SOLN: 5.0000 mg | Freq: Once | ORAL | Status: DC | PRN

## 2024-03-24 MED ORDER — BUPIVACAINE-EPINEPHRINE (PF) 0.25% -1:200000 IJ SOLN
INTRAMUSCULAR | Status: DC | PRN
  Administered 2024-03-24: 30 mL via PERINEURAL

## 2024-03-24 MED ORDER — CEFAZOLIN SODIUM-DEXTROSE 2-4 GM/100ML-% IV SOLN
2.0000 g | INTRAVENOUS | Status: AC
  Administered 2024-03-24: 2 g via INTRAVENOUS
  Filled 2024-03-24: qty 100

## 2024-03-24 MED ORDER — SUGAMMADEX SODIUM 200 MG/2ML IV SOLN
INTRAVENOUS | Status: DC | PRN
  Administered 2024-03-24: 200 mg via INTRAVENOUS

## 2024-03-24 MED ORDER — MORPHINE SULFATE (PF) 2 MG/ML IV SOLN: 0.5000 mg | INTRAVENOUS | Status: DC | PRN

## 2024-03-24 MED ORDER — TRAMADOL HCL 50 MG PO TABS
50.0000 mg | ORAL_TABLET | Freq: Four times a day (QID) | ORAL | Status: DC | PRN
  Administered 2024-03-24 – 2024-03-25 (×3): 50 mg via ORAL
  Filled 2024-03-24 (×3): qty 1

## 2024-03-24 MED ORDER — CHLORHEXIDINE GLUCONATE 0.12 % MT SOLN
15.0000 mL | Freq: Once | OROMUCOSAL | Status: AC
  Administered 2024-03-24: 15 mL via OROMUCOSAL

## 2024-03-24 MED ORDER — SUGAMMADEX SODIUM 200 MG/2ML IV SOLN
INTRAVENOUS | Status: AC
  Filled 2024-03-24: qty 2

## 2024-03-24 MED ORDER — AMISULPRIDE (ANTIEMETIC) 5 MG/2ML IV SOLN: 10.0000 mg | Freq: Once | INTRAVENOUS | Status: DC | PRN

## 2024-03-24 MED ORDER — METOCLOPRAMIDE HCL 5 MG PO TABS: 5.0000 mg | ORAL_TABLET | Freq: Three times a day (TID) | ORAL | Status: DC | PRN

## 2024-03-24 MED ORDER — EPHEDRINE SULFATE-NACL 50-0.9 MG/10ML-% IV SOSY
PREFILLED_SYRINGE | INTRAVENOUS | Status: DC | PRN
Start: 2024-03-24 — End: 2024-03-24
  Administered 2024-03-24: 5 mg via INTRAVENOUS

## 2024-03-24 MED ORDER — LORATADINE 10 MG PO TABS: 10.0000 mg | ORAL_TABLET | Freq: Every day | ORAL | Status: DC | PRN

## 2024-03-24 MED ORDER — ROCURONIUM BROMIDE 10 MG/ML (PF) SYRINGE
PREFILLED_SYRINGE | INTRAVENOUS | Status: DC | PRN
  Administered 2024-03-24: 40 mg via INTRAVENOUS
  Administered 2024-03-24: 10 mg via INTRAVENOUS

## 2024-03-24 MED ORDER — LOSARTAN POTASSIUM 50 MG PO TABS
50.0000 mg | ORAL_TABLET | Freq: Every day | ORAL | Status: DC
  Administered 2024-03-25: 50 mg via ORAL
  Filled 2024-03-24: qty 1

## 2024-03-24 MED ORDER — MAGNESIUM CITRATE PO SOLN: 1.0000 | Freq: Once | ORAL | Status: DC | PRN

## 2024-03-24 MED ORDER — POLYETHYLENE GLYCOL 3350 17 G PO PACK
17.0000 g | PACK | Freq: Every day | ORAL | Status: DC | PRN
Start: 2024-03-24 — End: 2024-03-25

## 2024-03-24 MED ORDER — OXYCODONE HCL 5 MG PO TABS: 5.0000 mg | ORAL_TABLET | Freq: Once | ORAL | Status: DC | PRN

## 2024-03-24 MED ORDER — LACTATED RINGERS IV SOLN: INTRAVENOUS | Status: DC

## 2024-03-24 MED ORDER — PROPOFOL 500 MG/50ML IV EMUL
INTRAVENOUS | Status: DC | PRN
  Administered 2024-03-24: 130 ug/kg/min via INTRAVENOUS

## 2024-03-24 MED ORDER — ARTIFICIAL TEARS OPHTHALMIC OINT
TOPICAL_OINTMENT | OPHTHALMIC | Status: AC
  Filled 2024-03-24: qty 3.5

## 2024-03-24 MED ORDER — ONDANSETRON HCL 4 MG/2ML IJ SOLN
4.0000 mg | Freq: Four times a day (QID) | INTRAMUSCULAR | Status: DC | PRN
  Administered 2024-03-24 – 2024-03-25 (×3): 4 mg via INTRAVENOUS
  Filled 2024-03-24 (×3): qty 2

## 2024-03-24 MED ORDER — FENTANYL CITRATE (PF) 100 MCG/2ML IJ SOLN
INTRAMUSCULAR | Status: DC | PRN
  Administered 2024-03-24 (×5): 50 ug via INTRAVENOUS

## 2024-03-24 MED ORDER — DOCUSATE SODIUM 100 MG PO CAPS
100.0000 mg | ORAL_CAPSULE | Freq: Two times a day (BID) | ORAL | Status: DC
  Filled 2024-03-24: qty 1

## 2024-03-24 MED ORDER — PHENOL 1.4 % MT LIQD: 1.0000 | OROMUCOSAL | Status: DC | PRN

## 2024-03-24 MED ORDER — ASPIRIN 81 MG PO CHEW
81.0000 mg | CHEWABLE_TABLET | Freq: Two times a day (BID) | ORAL | Status: DC
  Administered 2024-03-25: 81 mg via ORAL
  Filled 2024-03-24: qty 1

## 2024-03-24 MED ORDER — METOPROLOL SUCCINATE ER 50 MG PO TB24
50.0000 mg | ORAL_TABLET | Freq: Every day | ORAL | Status: DC
  Administered 2024-03-25: 50 mg via ORAL
  Filled 2024-03-24: qty 1

## 2024-03-24 MED ORDER — SODIUM CHLORIDE 0.9 % IV SOLN: INTRAVENOUS | Status: DC

## 2024-03-24 SURGICAL SUPPLY — 35 items
BAG COUNTER SPONGE SURGICOUNT (BAG) IMPLANT
BAG ZIPLOCK 12X15 (MISCELLANEOUS) IMPLANT
BLADE SAG 18X100X1.27 (BLADE) ×1 IMPLANT
COVER PERINEAL POST (MISCELLANEOUS) ×1 IMPLANT
COVER SURGICAL LIGHT HANDLE (MISCELLANEOUS) ×1 IMPLANT
CUP ACETBLR 48 OD SECTOR II (Hips) IMPLANT
DERMABOND ADVANCED .7 DNX12 (GAUZE/BANDAGES/DRESSINGS) ×1 IMPLANT
DRAPE FOOT SWITCH (DRAPES) ×1 IMPLANT
DRAPE STERI IOBAN 125X83 (DRAPES) ×1 IMPLANT
DRAPE U-SHAPE 47X51 STRL (DRAPES) ×2 IMPLANT
DRSG AQUACEL AG ADV 3.5X10 (GAUZE/BANDAGES/DRESSINGS) ×1 IMPLANT
DURAPREP 26ML APPLICATOR (WOUND CARE) ×1 IMPLANT
ELECT REM PT RETURN 15FT ADLT (MISCELLANEOUS) ×1 IMPLANT
GLOVE BIO SURGEON STRL SZ 6.5 (GLOVE) IMPLANT
GLOVE BIO SURGEON STRL SZ7 (GLOVE) IMPLANT
GLOVE BIO SURGEON STRL SZ8 (GLOVE) ×1 IMPLANT
GLOVE BIOGEL PI IND STRL 7.0 (GLOVE) IMPLANT
GLOVE BIOGEL PI IND STRL 8 (GLOVE) ×1 IMPLANT
GOWN STRL REUS W/ TWL LRG LVL3 (GOWN DISPOSABLE) ×2 IMPLANT
HEAD FEM STD 28X+1.5 STRL (Hips) IMPLANT
HOLDER FOLEY CATH W/STRAP (MISCELLANEOUS) ×1 IMPLANT
KIT TURNOVER KIT A (KITS) ×1 IMPLANT
LINER ACET ALTRX NEUT +4X28X48 (Hips) IMPLANT
MANIFOLD NEPTUNE II (INSTRUMENTS) ×1 IMPLANT
PACK ANTERIOR HIP CUSTOM (KITS) ×1 IMPLANT
PENCIL SMOKE EVACUATOR COATED (MISCELLANEOUS) ×1 IMPLANT
SPIKE FLUID TRANSFER (MISCELLANEOUS) ×1 IMPLANT
STEM FEMORAL SZ 6MM STD ACTIS (Stem) IMPLANT
SUT ETHIBOND NAB CT1 #1 30IN (SUTURE) ×1 IMPLANT
SUT MNCRL AB 4-0 PS2 18 (SUTURE) ×1 IMPLANT
SUT VIC AB 2-0 CT1 TAPERPNT 27 (SUTURE) ×2 IMPLANT
SUTURE STRATFX 0 PDS 27 VIOLET (SUTURE) ×1 IMPLANT
TOWEL GREEN STERILE FF (TOWEL DISPOSABLE) ×1 IMPLANT
TRAY FOLEY MTR SLVR 16FR STAT (SET/KITS/TRAYS/PACK) ×1 IMPLANT
TUBE SUCTION HIGH CAP CLEAR NV (SUCTIONS) ×1 IMPLANT

## 2024-03-24 NOTE — Anesthesia Procedure Notes (Signed)
 Procedure Name: Intubation Date/Time: 03/24/2024 10:57 AM  Performed by: Carleton Garnette SAUNDERS, CRNAPre-anesthesia Checklist: Patient identified, Emergency Drugs available, Suction available, Patient being monitored and Timeout performed Patient Re-evaluated:Patient Re-evaluated prior to induction Oxygen Delivery Method: Circle system utilized Preoxygenation: Pre-oxygenation with 100% oxygen Induction Type: IV induction Ventilation: Mask ventilation without difficulty Laryngoscope Size: Mac and 3 Grade View: Grade I Tube type: Oral Tube size: 7.0 mm Number of attempts: 1 Airway Equipment and Method: Stylet Placement Confirmation: ETT inserted through vocal cords under direct vision, positive ETCO2 and breath sounds checked- equal and bilateral Secured at: 22 cm Tube secured with: Tape Dental Injury: Teeth and Oropharynx as per pre-operative assessment

## 2024-03-24 NOTE — Anesthesia Postprocedure Evaluation (Signed)
 Anesthesia Post Note  Patient: Vanessa Bonilla  Procedure(s) Performed: ARTHROPLASTY, HIP, TOTAL, ANTERIOR APPROACH (Right: Hip)     Patient location during evaluation: PACU Anesthesia Type: General Level of consciousness: awake Pain management: pain level controlled Vital Signs Assessment: post-procedure vital signs reviewed and stable Respiratory status: spontaneous breathing, nonlabored ventilation and respiratory function stable Cardiovascular status: blood pressure returned to baseline and stable Postop Assessment: no apparent nausea or vomiting Anesthetic complications: no   No notable events documented.  Last Vitals:  Vitals:   03/24/24 1245 03/24/24 1343  BP: (!) 170/71 (!) 159/66  Pulse: (!) 50 (!) 56  Resp: 13 16  Temp:  36.6 C  SpO2: 95% 98%    Last Pain:  Vitals:   03/24/24 1343  TempSrc: Oral  PainSc: 0-No pain                 Delon Aisha Arch

## 2024-03-24 NOTE — Care Plan (Signed)
 Ortho Bundle Case Management Note  Patient Details  Name: Vanessa Bonilla MRN: [id number] Date of Birth: [birth year]  R THA on 03/24/24  DCP: Home with 2 daughter in laws   DME: No needs. Has RW and cane PT: HEP                   DME Arranged:  N/A DME Agency:  NA  HH Arranged:    HH Agency:     Additional Comments: Please contact me with any questions of if this plan should need to change.  Lyle Pepper, CCM [facility] [phone]   03/24/2024, 8:05 AM

## 2024-03-24 NOTE — Interval H&P Note (Signed)
 History and Physical Interval Note:  03/24/2024 8:10 AM  Vanessa Bonilla  has presented today for surgery, with the diagnosis of Right hip osteoarthritis.  The various methods of treatment have been discussed with the patient and family. After consideration of risks, benefits and other options for treatment, the patient has consented to  Procedure(s): ARTHROPLASTY, HIP, TOTAL, ANTERIOR APPROACH (Right) as a surgical intervention.  The patient's history has been reviewed, patient examined, no change in status, stable for surgery.  I have reviewed the patient's chart and labs.  Questions were answered to the patient's satisfaction.     Vanessa Bonilla

## 2024-03-24 NOTE — Discharge Instructions (Signed)
Frank Aluisio, MD Total Joint Specialist [facility] [place] [address] [place], [place] [address] [phone]  ANTERIOR APPROACH TOTAL HIP REPLACEMENT POSTOPERATIVE DIRECTIONS     Hip Rehabilitation, Guidelines Following Surgery  The results of a hip operation are greatly improved after range of motion and muscle strengthening exercises. Follow all safety measures which are given to protect your hip. If any of these exercises cause increased pain or swelling in your joint, decrease the amount until you are comfortable again. Then slowly increase the exercises. Call your caregiver if you have problems or questions.   BLOOD CLOT PREVENTION Take an 81 mg Aspirin two times a day for three weeks following surgery. Then take an 81 mg Aspirin once a day for three weeks. Then discontinue Aspirin. You may resume your vitamins/supplements upon discharge from the hospital. Do not take any NSAIDs (Advil, Aleve, Ibuprofen, Meloxicam, etc.) until you are 3 weeks out from surgery  HOME CARE INSTRUCTIONS  Remove items at home which could result in a fall. This includes throw rugs or furniture in walking pathways.  ICE to the affected hip as frequently as 20-30 minutes an hour and then as needed for pain and swelling. Continue to use ice on the hip for pain and swelling from surgery. You may notice swelling that will progress down to the foot and ankle. This is normal after surgery. Elevate the leg when you are not up walking on it.   Continue to use the breathing machine which will help keep your temperature down.  It is common for your temperature to cycle up and down following surgery, especially at night when you are not up moving around and exerting yourself.  The breathing machine keeps your lungs expanded and your temperature down.  DIET You may resume your previous home diet once your are discharged from the hospital.  DRESSING / WOUND CARE / SHOWERING You have an  adhesive waterproof bandage over the incision. Leave this in place until your first follow-up appointment. Once you remove this you will not need to place another bandage.  You may begin showering 3 days following surgery, but do not submerge the incision under water.  ACTIVITY For the first 3-5 days, it is important to rest and keep the operative leg elevated. You should, as a general rule, rest for 50 minutes and walk/stretch for 10 minutes per hour. After 5 days, you may slowly increase activity as tolerated.  Perform the exercises you were provided twice a day for about 15-20 minutes each session. Begin these 2 days following surgery. Walk with your walker as instructed. Use the walker until you are comfortable transitioning to a cane. Walk with the cane in the opposite hand of the operative leg. You may discontinue the cane once you are comfortable and walking steadily. Avoid periods of inactivity such as sitting longer than an hour when not asleep. This helps prevent blood clots.  Do not drive a car for 6 weeks or until released by your surgeon.  Do not drive while taking narcotics.  TED HOSE STOCKINGS Wear the elastic stockings on both legs for three weeks following surgery during the day. You may remove them at night while sleeping.  WEIGHT BEARING Weight bearing as tolerated with assist device (walker, cane, etc) as directed, use it as long as suggested by your surgeon or therapist, typically at least 4-6 weeks.  POSTOPERATIVE CONSTIPATION PROTOCOL Constipation - defined medically as fewer than three stools per week and severe constipation as   less than one stool per week.  One of the most common issues patients have following surgery is constipation.  Even if you have a regular bowel pattern at home, your normal regimen is likely to be disrupted due to multiple reasons following surgery.  Combination of anesthesia, postoperative narcotics, change in appetite and fluid intake all can  affect your bowels.  In order to avoid complications following surgery, here are some recommendations in order to help you during your recovery period.  Colace (docusate) - Pick up an over-the-counter form of Colace or another stool softener and take twice a day as long as you are requiring postoperative pain medications.  Take with a full glass of water daily.  If you experience loose stools or diarrhea, hold the colace until you stool forms back up.  If your symptoms do not get better within 1 week or if they get worse, check with your doctor. Dulcolax (bisacodyl) - Pick up over-the-counter and take as directed by the product packaging as needed to assist with the movement of your bowels.  Take with a full glass of water.  Use this product as needed if not relieved by Colace only.  MiraLax (polyethylene glycol) - Pick up over-the-counter to have on hand.  MiraLax is a solution that will increase the amount of water in your bowels to assist with bowel movements.  Take as directed and can mix with a glass of water, juice, soda, coffee, or tea.  Take if you go more than two days without a movement.Do not use MiraLax more than once per day. Call your doctor if you are still constipated or irregular after using this medication for 7 days in a row.  If you continue to have problems with postoperative constipation, please contact the office for further assistance and recommendations.  If you experience "the worst abdominal pain ever" or develop nausea or vomiting, please contact the office immediatly for further recommendations for treatment.  ITCHING  If you experience itching with your medications, try taking only a single pain pill, or even half a pain pill at a time.  You can also use Benadryl over the counter for itching or also to help with sleep.   MEDICATIONS See your medication summary on the "After Visit Summary" that the nursing staff will review with you prior to discharge.  You may have some home  medications which will be placed on hold until you complete the course of blood thinner medication.  It is important for you to complete the blood thinner medication as prescribed by your surgeon.  Continue your approved medications as instructed at time of discharge.  PRECAUTIONS If you experience chest pain or shortness of breath - call 911 immediately for transfer to the hospital emergency department.  If you develop a fever greater that 101 F, purulent drainage from wound, increased redness or drainage from wound, foul odor from the wound/dressing, or calf pain - CONTACT YOUR SURGEON.                                                   FOLLOW-UP APPOINTMENTS Make sure you keep all of your appointments after your operation with your surgeon and caregivers. You should call the office at the above phone number and make an appointment for approximately two weeks after the date of your surgery or on the   date instructed by your surgeon outlined in the "After Visit Summary".  RANGE OF MOTION AND STRENGTHENING EXERCISES  These exercises are designed to help you keep full movement of your hip joint. Follow your caregiver's or physical therapist's instructions. Perform all exercises about fifteen times, three times per day or as directed. Exercise both hips, even if you have had only one joint replacement. These exercises can be done on a training (exercise) mat, on the floor, on a table or on a bed. Use whatever works the best and is most comfortable for you. Use music or television while you are exercising so that the exercises are a pleasant break in your day. This will make your life better with the exercises acting as a break in routine you can look forward to.  Lying on your back, slowly slide your foot toward your buttocks, raising your knee up off the floor. Then slowly slide your foot back down until your leg is straight again.  Lying on your back spread your legs as far apart as you can without causing  discomfort.  Lying on your side, raise your upper leg and foot straight up from the floor as far as is comfortable. Slowly lower the leg and repeat.  Lying on your back, tighten up the muscle in the front of your thigh (quadriceps muscles). You can do this by keeping your leg straight and trying to raise your heel off the floor. This helps strengthen the largest muscle supporting your knee.  Lying on your back, tighten up the muscles of your buttocks both with the legs straight and with the knee bent at a comfortable angle while keeping your heel on the floor.   POST-OPERATIVE OPIOID TAPER INSTRUCTIONS: It is important to wean off of your opioid medication as soon as possible. If you do not need pain medication after your surgery it is ok to stop day one. Opioids include: Codeine, Hydrocodone(Norco, Vicodin), Oxycodone(Percocet, oxycontin) and hydromorphone amongst others.  Long term and even short term use of opiods can cause: Increased pain response Dependence Constipation Depression Respiratory depression And more.  Withdrawal symptoms can include Flu like symptoms Nausea, vomiting And more Techniques to manage these symptoms Hydrate well Eat regular healthy meals Stay active Use relaxation techniques(deep breathing, meditating, yoga) Do Not substitute Alcohol to help with tapering If you have been on opioids for less than two weeks and do not have pain than it is ok to stop all together.  Plan to wean off of opioids This plan should start within one week post op of your joint replacement. Maintain the same interval or time between taking each dose and first decrease the dose.  Cut the total daily intake of opioids by one tablet each day Next start to increase the time between doses. The last dose that should be eliminated is the evening dose.   IF YOU ARE TRANSFERRED TO A SKILLED REHAB FACILITY If the patient is transferred to a skilled rehab facility following release from the  hospital, a list of the current medications will be sent to the facility for the patient to continue.  When discharged from the skilled rehab facility, please have the facility set up the patient's Home Health Physical Therapy prior to being released. Also, the skilled facility will be responsible for providing the patient with their medications at time of release from the facility to include their pain medication, the muscle relaxants, and their blood thinner medication. If the patient is still at the rehab facility   at time of the two week follow up appointment, the skilled rehab facility will also need to assist the patient in arranging follow up appointment in our office and any transportation needs.  MAKE SURE YOU:  Understand these instructions.  Get help right away if you are not doing well or get worse.    DENTAL ANTIBIOTICS:  In most cases prophylactic antibiotics for Dental procdeures after total joint surgery are not necessary.  Exceptions are as follows:  1. History of prior total joint infection  2. Severely immunocompromised (Organ Transplant, cancer chemotherapy, Rheumatoid biologic meds such as Humera)  3. Poorly controlled diabetes (A1C &gt; 8.0, blood glucose over 200)  If you have one of these conditions, contact your surgeon for an antibiotic prescription, prior to your dental procedure.    Pick up stool softner and laxative for home use following surgery while on pain medications. Do not submerge incision under water. Please use good hand washing techniques while changing dressing each day. May shower starting three days after surgery. Please use a clean towel to pat the incision dry following showers. Continue to use ice for pain and swelling after surgery. Do not use any lotions or creams on the incision until instructed by your surgeon.  

## 2024-03-24 NOTE — Op Note (Signed)
 OPERATIVE REPORT- TOTAL HIP ARTHROPLASTY   PREOPERATIVE DIAGNOSIS: Osteoarthritis of the Right hip.   POSTOPERATIVE DIAGNOSIS: Osteoarthritis of the Right  hip.   PROCEDURE: Right total hip arthroplasty, anterior approach.   SURGEON: Dempsey Moan, MD   ASSISTANT: Randine Ricks, PA-C  ANESTHESIA:  General  ESTIMATED BLOOD LOSS:-400 mL    DRAINS: None  COMPLICATIONS: None   CONDITION: PACU - hemodynamically stable.   BRIEF CLINICAL NOTE: Vanessa Bonilla is a 78 y.o. female who has advanced end-  stage arthritis of their Right  hip with progressively worsening pain and  dysfunction.The patient has failed nonoperative management and presents for  total hip arthroplasty.   PROCEDURE IN DETAIL: After successful administration of spinal  anesthetic, the traction boots for the [facility] bed were placed on both  feet and the patient was placed onto the [facility] bed, boots placed into the leg  holders. The Right hip was then isolated from the perineum with plastic  drapes and prepped and draped in the usual sterile fashion. ASIS and  greater trochanter were marked and a oblique incision was made, starting  at about 1 cm lateral and 2 cm distal to the ASIS and coursing towards  the anterior cortex of the femur. The skin was cut with a 10 blade  through subcutaneous tissue to the level of the fascia overlying the  tensor fascia lata muscle. The fascia was then incised in line with the  incision at the junction of the anterior third and posterior 2/3rd. The  muscle was teased off the fascia and then the interval between the TFL  and the rectus was developed. The Hohmann retractor was then placed at  the top of the femoral neck over the capsule. The vessels overlying the  capsule were cauterized and the fat on top of the capsule was removed.  A Hohmann retractor was then placed anterior underneath the rectus  femoris to give exposure to the entire anterior capsule. A T-shaped   capsulotomy was performed. The edges were tagged and the femoral head  was identified.       Osteophytes are removed off the superior acetabulum.  The femoral neck was then cut in situ with an oscillating saw. Traction  was then applied to the left lower extremity utilizing the [facility]  traction. The femoral head was then removed. Retractors were placed  around the acetabulum and then circumferential removal of the labrum was  performed. Osteophytes were also removed. Reaming starts at 45 mm to  medialize and  Increased in 2 mm increments to 47 mm. We reamed in  approximately 40 degrees of abduction, 20 degrees anteversion. A 48 mm  pinnacle acetabular shell was then impacted in anatomic position under  fluoroscopic guidance with excellent purchase. We did not need to place  any additional dome screws. A 28 mm neutral + 4 Altrx liner was then  placed into the acetabular shell.       The femoral lift was then placed along the lateral aspect of the femur  just distal to the vastus ridge. The leg was  externally rotated and capsule  was stripped off the inferior aspect of the femoral neck down to the  level of the lesser trochanter, this was done with electrocautery. The femur was lifted after this was performed. The  leg was then placed in an extended and adducted position essentially delivering the femur. We also removed the capsule superiorly and the piriformis from the piriformis fossa to  gain excellent exposure of the  proximal femur. Rongeur was used to remove some cancellous bone to get  into the lateral portion of the proximal femur for placement of the  initial starter reamer. The starter broaches was placed  the starter broach  and was shown to go down the center of the canal. Broaching  with the Actis system was then performed starting at size 0  coursing  Up to size 6. A size 6 had excellent torsional and rotational  and axial stability. The trial standard offset neck was then placed   with a 28 + 1.5 trial head. The hip was then reduced. We confirmed that  the stem was in the canal both on AP and lateral x-rays. It also has excellent sizing. The hip was reduced with outstanding stability through full extension and full external rotation.. AP pelvis was taken and the leg lengths were measured and found to be equal. Hip was then dislocated again and the femoral head and neck removed. The  femoral broach was removed. Size 6 Actis stem with a standard offset  neck was then impacted into the femur following native anteversion. Has  excellent purchase in the canal. Excellent torsional and rotational and  axial stability. It is confirmed to be in the canal on AP and lateral  fluoroscopic views. The 28 + 1.5 metal head was placed and the hip  reduced with outstanding stability. Again AP pelvis was taken and it  confirmed that the leg lengths were equal. The wound was then copiously  irrigated with saline solution and the capsule reattached and repaired  with Ethibond suture. 30 ml of .25% Bupivicaine was  injected into the capsule and into the edge of the tensor fascia lata as well as subcutaneous tissue. The fascia overlying the tensor fascia lata was then closed with a running #1 V-Loc. Subcu was closed with interrupted 2-0 Vicryl and subcuticular running 4-0 Monocryl. Incision was cleaned  and dried. Steri-Strips and a bulky sterile dressing applied. The patient was awakened and transported to  recovery in stable condition.        Please note that a surgical assistant was a medical necessity for this procedure to perform it in a safe and expeditious manner. Assistant was necessary to provide appropriate retraction of vital neurovascular structures and to prevent femoral fracture and allow for anatomic placement of the prosthesis.  Dempsey Moan, M.D.

## 2024-03-24 NOTE — Plan of Care (Signed)

## 2024-03-24 NOTE — Transfer of Care (Signed)
 Immediate Anesthesia Transfer of Care Note  Patient: Vanessa Bonilla  Procedure(s) Performed: ARTHROPLASTY, HIP, TOTAL, ANTERIOR APPROACH (Right: Hip)  Patient Location: PACU  Anesthesia Type:General  Level of Consciousness: drowsy  Airway & Oxygen Therapy: Patient Spontanous Breathing and Patient connected to face mask oxygen  Post-op Assessment: Report given to RN and Post -op Vital signs reviewed and stable  Post vital signs: Reviewed and stable  Last Vitals:  Vitals Value Taken Time  BP 158/69 03/24/24 12:22  Temp    Pulse 60 03/24/24 12:23  Resp 12 03/24/24 12:23  SpO2 100 % 03/24/24 12:23  Vitals shown include unfiled device data.  Last Pain:  Vitals:   03/24/24 0827  TempSrc:   PainSc: 0-No pain      Patients Stated Pain Goal: 4 (03/24/24 0827)  Complications: No notable events documented.

## 2024-03-24 NOTE — Evaluation (Signed)
 Physical Therapy Evaluation Patient Details Name: LENDA BARATTA MRN: [id number] DOB: May 13, 1946 Today's Date: 03/24/2024  History of Present Illness  Pt is 78 yo female s/p R anterior THA on 03/24/24.  Pt with hx including but not limited to HTN, DJD, OA, scoliosis sx, L THA  Clinical Impression  Pt is s/p THA resulting in the deficits listed below (see PT Problem List). At baseline, pt is independent and lives alone.  Her daughter in laws will be staying with her at d/c.  Today, pt motivated and with good pain control but limited by nausea and vomiting despite premedication (had general anesthesia).  Pt with good strength in hip and moving well - expected to progress well after n/v improve.  Pt will benefit from acute skilled PT to increase their independence and safety with mobility to facilitate discharge.          If plan is discharge home, recommend the following: A little help with walking and/or transfers;A little help with bathing/dressing/bathroom;Assistance with cooking/housework;Help with stairs or ramp for entrance   Can travel by private vehicle        Equipment Recommendations None recommended by PT  Recommendations for Other Services       Functional Status Assessment Patient has had a recent decline in their functional status and demonstrates the ability to make significant improvements in function in a reasonable and predictable amount of time.     Precautions / Restrictions Precautions Precautions: Fall Restrictions RLE Weight Bearing Per Provider Order: Weight bearing as tolerated      Mobility  Bed Mobility Overal bed mobility: Needs Assistance Bed Mobility: Supine to Sit, Sit to Supine     Supine to sit: Min assist Sit to supine: Min assist        Transfers Overall transfer level: Needs assistance Equipment used: Rolling walker (2 wheels) Transfers: Sit to/from Stand, Bed to chair/wheelchair/BSC Sit to Stand: Min assist   Step pivot transfers:  Min assist       General transfer comment: Cues for hand placement; light min A to steady; stood from bed and bsc    Ambulation/Gait Ambulation/Gait assistance: Contact guard assist Gait Distance (Feet): 10 Feet Assistive device: Rolling walker (2 wheels) Gait Pattern/deviations: Step-to pattern, Decreased stride length Gait velocity: decreased     General Gait Details: Cues for RW proximity and sequencing; distance limited by nausea  Stairs            Wheelchair Mobility     Tilt Bed    Modified Rankin (Stroke Patients Only)       Balance Overall balance assessment: Needs assistance Sitting-balance support: No upper extremity supported Sitting balance-Leahy Scale: Good     Standing balance support: Bilateral upper extremity supported, No upper extremity supported Standing balance-Leahy Scale: Fair Standing balance comment: RW to ambulate but stood for ADLs wtihout UE support                             Pertinent Vitals/Pain Pain Assessment Pain Assessment: 0-10 Pain Score: 1  Pain Location: R hip Pain Descriptors / Indicators: Aching Pain Intervention(s): Limited activity within patient's tolerance, Monitored during session, Repositioned, Ice applied    Home Living Family/patient expects to be discharged to:: Private residence Living Arrangements: Alone Available Help at Discharge: Family;Available 24 hours/day (2 daughter in laws will stay) Type of Home:  (townhouse) Home Access: Stairs to enter Entrance Stairs-Rails: Right Entrance Stairs-Number of Steps: 1  Home Layout: One level Home Equipment: Grab bars - tub/shower;BSC/3in1;Rolling Walker (2 wheels);Cane - single point      Prior Function Prior Level of Function : Independent/Modified Independent;Driving             Mobility Comments: Could ambulate in community but was getting difficult.  Used RW or cane at times. ADLs Comments: independent wtih adls and basic iadls      Extremity/Trunk Assessment   Upper Extremity Assessment Upper Extremity Assessment: Overall WFL for tasks assessed    Lower Extremity Assessment Lower Extremity Assessment: LLE deficits/detail;RLE deficits/detail RLE Deficits / Details: ROM WFL; MMT 5/5 ankle, 3/5 hip and knee not further tested LLE Deficits / Details: ROM WFL; MMT 5/5    Cervical / Trunk Assessment Cervical / Trunk Assessment: Other exceptions Cervical / Trunk Exceptions: Hx of scolosis with surgery  Communication        Cognition Arousal: Alert Behavior During Therapy: WFL for tasks assessed/performed   PT - Cognitive impairments: No apparent impairments                                 Cueing       General Comments General comments (skin integrity, edema, etc.):   Pt does report hx of vertigo and then had a flair day before surgery when she got out of bed quickly. Reports dizzy not lightheaded. Spoke to a friend who had similar experience and then self treated (sounds like she did self epley and took dramamine) -felt better after.  Today, had some nausea and vomiting and described as queasy with mobility, plus was feeling queasy at rest - pt did have general anesthesia and hx of PONV so suspect related to that.   Pt also with purewick present, reports overactive bladder and dtr in law reports steady stream in purewick at rest.  Pt had incontinence during session and used BSC.  Did replace purewick due to incontinence.   Increased time for session to manage N/V and incontinence.  Pt did have zofran  prior.     Exercises     Assessment/Plan    PT Assessment Patient needs continued PT services  PT Problem List Decreased strength;Decreased mobility;Decreased range of motion;Decreased activity tolerance;Decreased balance;Decreased knowledge of use of DME;Pain       PT Treatment Interventions DME instruction;Therapeutic exercise;Gait training;Balance training;Stair training;Functional  mobility training;Therapeutic activities;Patient/family education;Modalities    PT Goals (Current goals can be found in the Care Plan section)  Acute Rehab PT Goals Patient Stated Goal: return home PT Goal Formulation: With patient/family Time For Goal Achievement: 04/07/24 Potential to Achieve Goals: Good    Frequency 7X/week     Co-evaluation               AM-PAC PT 6 Clicks Mobility  Outcome Measure Help needed turning from your back to your side while in a flat bed without using bedrails?: A Little Help needed moving from lying on your back to sitting on the side of a flat bed without using bedrails?: A Little Help needed moving to and from a bed to a chair (including a wheelchair)?: A Little Help needed standing up from a chair using your arms (e.g., wheelchair or bedside chair)?: A Little Help needed to walk in hospital room?: A Little Help needed climbing 3-5 steps with a railing? : A Little 6 Click Score: 18    End of Session Equipment Utilized During Treatment: Gait belt Activity  Tolerance: Treatment limited secondary to medical complications (Comment) Patient left: in bed;with call bell/phone within reach;with bed alarm set;with SCD's reapplied;with family/visitor present Nurse Communication: Mobility status PT Visit Diagnosis: Other abnormalities of gait and mobility (R26.89);Muscle weakness (generalized) (M62.81)    Time: 8361-8269 PT Time Calculation (min) (ACUTE ONLY): 52 min   Charges:   PT Evaluation $PT Eval Low Complexity: 1 Low PT Treatments $Gait Training: 8-22 mins $Therapeutic Activity: 8-22 mins PT General Charges $$ ACUTE PT VISIT: 1 Visit         Benjiman, PT Acute Rehab [facility] Rehab [phone]   Benjiman VEAR Mulberry 03/24/2024, 5:45 PM

## 2024-03-25 ENCOUNTER — Encounter (HOSPITAL_COMMUNITY): Payer: Self-pay | Admitting: Orthopedic Surgery

## 2024-03-25 DIAGNOSIS — M1611 Unilateral primary osteoarthritis, right hip: Secondary | ICD-10-CM | POA: Diagnosis not present

## 2024-03-25 LAB — BASIC METABOLIC PANEL WITH GFR
Anion gap: 12 (ref 5–15)
BUN: 9 mg/dL (ref 8–23)
CO2: 22 mmol/L (ref 22–32)
Calcium: 8.7 mg/dL — ABNORMAL LOW (ref 8.9–10.3)
Chloride: 103 mmol/L (ref 98–111)
Creatinine, Ser: 0.52 mg/dL (ref 0.44–1.00)
GFR, Estimated: >60 mL/min (ref >60)
Glucose, Bld: 177 mg/dL — ABNORMAL HIGH (ref 70–99)
Potassium: 3.3 mmol/L — ABNORMAL LOW (ref 3.5–5.1)
Sodium: 137 mmol/L (ref 135–145)

## 2024-03-25 LAB — CBC
HCT: 31.9 % — ABNORMAL LOW (ref 36.0–46.0)
Hemoglobin: 10.9 g/dL — ABNORMAL LOW (ref 12.0–15.0)
MCH: 30.7 pg (ref 26.0–34.0)
MCHC: 34.2 g/dL (ref 30.0–36.0)
MCV: 89.9 fL (ref 80.0–100.0)
Platelets: 238 K/uL (ref 150–400)
RBC: 3.55 MIL/uL — ABNORMAL LOW (ref 3.87–5.11)
RDW: 12.9 % (ref 11.5–15.5)
WBC: 17 K/uL — ABNORMAL HIGH (ref 4.0–10.5)
nRBC: 0 % (ref 0.0–0.2)

## 2024-03-25 MED ORDER — METHOCARBAMOL 500 MG PO TABS: 500.0000 mg | ORAL_TABLET | Freq: Four times a day (QID) | ORAL | 0 refills | Status: AC | PRN

## 2024-03-25 MED ORDER — TRAMADOL HCL 50 MG PO TABS: 50.0000 mg | ORAL_TABLET | Freq: Four times a day (QID) | ORAL | 0 refills | Status: AC | PRN

## 2024-03-25 MED ORDER — ASPIRIN 81 MG PO CHEW: 81.0000 mg | CHEWABLE_TABLET | Freq: Two times a day (BID) | ORAL | 0 refills | Status: AC

## 2024-03-25 MED ORDER — POTASSIUM CHLORIDE CRYS ER 20 MEQ PO TBCR
40.0000 meq | EXTENDED_RELEASE_TABLET | Freq: Once | ORAL | Status: AC
  Administered 2024-03-25: 40 meq via ORAL
  Filled 2024-03-25: qty 2

## 2024-03-25 MED ORDER — ONDANSETRON HCL 4 MG PO TABS: 4.0000 mg | ORAL_TABLET | Freq: Four times a day (QID) | ORAL | 0 refills | Status: AC | PRN

## 2024-03-25 MED ORDER — HYDROCODONE-ACETAMINOPHEN 5-325 MG PO TABS: 1.0000 | ORAL_TABLET | Freq: Four times a day (QID) | ORAL | 0 refills | Status: AC | PRN

## 2024-03-25 NOTE — Progress Notes (Signed)
   Subjective: 1 Day Post-Op Procedure(s) (LRB): ARTHROPLASTY, HIP, TOTAL, ANTERIOR APPROACH (Right) Patient reports pain as mild.   Patient seen in rounds by Dr. Melodi. Patient had issues with N/V yesterday (had general anesthesia). Improved this AM. Denies chest pain or SOB. No issues overnight.  We will continue therapy today, ambulated 10' yesterday.   Objective: Vital signs in last 24 hours: Temp:  [97.5 F (36.4 C)-99.2 F (37.3 C)] 98.2 F (36.8 C) (09/04 0444) Pulse Rate:  [50-82] 82 (09/04 0444) Resp:  [12-19] 16 (09/04 0444) BP: (151-186)/(66-81) 165/81 (09/04 0444) SpO2:  [95 %-100 %] 98 % (09/04 0444) Weight:  [47.2 kg] 47.2 kg (09/03 0823)  Intake/Output from previous day:  Intake/Output Summary (Last 24 hours) at 03/25/2024 0736 Last data filed at 03/25/2024 0600 Gross per 24 hour  Intake 3069.37 ml  Output 1130 ml  Net 1939.37 ml     Intake/Output this shift: No intake/output data recorded.  Labs: Recent Labs    03/25/24 0335  HGB 10.9*   Recent Labs    03/25/24 0335  WBC 17.0*  RBC 3.55*  HCT 31.9*  PLT 238   Recent Labs    03/25/24 0335  NA 137  K 3.3*  CL 103  CO2 22  BUN 9  CREATININE 0.52  GLUCOSE 177*  CALCIUM 8.7*   No results for input(s): LABPT, INR in the last 72 hours.  Exam: General - Patient is Alert and Oriented Extremity - Neurologically intact Neurovascular intact Sensation intact distally Dorsiflexion/Plantar flexion intact Dressing - dressing C/D/I Motor Function - intact, moving foot and toes well on exam.   Past Medical History:  Diagnosis Date   Aortic regurgitation    Mild by echo 2022   DJD (degenerative joint disease), cervical    Hyperlipidemia    Hypertension    Left hip pain    Menopause    Mitral regurgitation    Mild by echo 2022   PAC (premature atrial contraction)    PONV (postoperative nausea and vomiting)    Primary osteoarthritis involving multiple joints    Recurrent UTI      Assessment/Plan: 1 Day Post-Op Procedure(s) (LRB): ARTHROPLASTY, HIP, TOTAL, ANTERIOR APPROACH (Right) Principal Problem:   OA (osteoarthritis) of hip Active Problems:   Primary osteoarthritis of right hip  Estimated body mass index is 20.65 kg/m as calculated from the following:   Height as of this encounter: 4' 11.5 (1.511 m).   Weight as of this encounter: 47.2 kg. Advance diet Up with therapy D/C IV fluids  DVT Prophylaxis - Aspirin  Weight bearing as tolerated. Continue therapy.  Plan is to go Home after hospital stay. Plan for discharge with HEP later today if progresses with therapy and meeting goals. Follow-up in the office in 2 weeks.  The PDMP database was reviewed today prior to any opioid medications being prescribed to this patient.  Roxie Mess, PA-C Orthopedic Surgery [phone] 03/25/2024, 7:36 AM

## 2024-03-25 NOTE — Plan of Care (Signed)

## 2024-03-25 NOTE — Plan of Care (Signed)
  Problem: Education: Goal: Knowledge of General Education information will improve Description: Including pain rating scale, medication(s)/side effects and non-pharmacologic comfort measures Outcome: Progressing   Problem: Health Behavior/Discharge Planning: Goal: Ability to manage health-related needs will improve Outcome: Progressing   Problem: Clinical Measurements: Goal: Ability to maintain clinical measurements within normal limits will improve Outcome: Progressing Goal: Will remain free from infection Outcome: Progressing Goal: Diagnostic test results will improve Outcome: Progressing Goal: Respiratory complications will improve Outcome: Progressing Goal: Cardiovascular complication will be avoided Outcome: Progressing   Problem: Activity: Goal: Risk for activity intolerance will decrease Outcome: Progressing   Problem: Nutrition: Goal: Adequate nutrition will be maintained Outcome: Progressing   Problem: Coping: Goal: Level of anxiety will decrease Outcome: Progressing   Problem: Elimination: Goal: Will not experience complications related to bowel motility Outcome: Progressing   Problem: Pain Managment: Goal: General experience of comfort will improve and/or be controlled Outcome: Progressing   Problem: Safety: Goal: Ability to remain free from injury will improve Outcome: Progressing   Problem: Skin Integrity: Goal: Risk for impaired skin integrity will decrease Outcome: Progressing   Problem: Education: Goal: Knowledge of the prescribed therapeutic regimen will improve Outcome: Progressing Goal: Understanding of discharge needs will improve Outcome: Progressing Goal: Individualized Educational Video(s) Outcome: Completed/Met   Problem: Activity: Goal: Ability to avoid complications of mobility impairment will improve Outcome: Progressing Goal: Ability to tolerate increased activity will improve Outcome: Progressing   Problem: Clinical  Measurements: Goal: Postoperative complications will be avoided or minimized Outcome: Progressing   Problem: Pain Management: Goal: Pain level will decrease with appropriate interventions Outcome: Progressing   Problem: Skin Integrity: Goal: Will show signs of wound healing Outcome: Progressing

## 2024-03-25 NOTE — Care Management Obs Status (Signed)
 MEDICARE OBSERVATION STATUS NOTIFICATION   Patient Details  Name: Vanessa Bonilla MRN: [id number] Date of Birth: 1945-09-17   Medicare Observation Status Notification Given:  Yes    Heather DELENA Saltness, LCSW 03/25/2024, 9:46 AM

## 2024-03-25 NOTE — TOC Transition Note (Signed)
 Transition of Care [facility]) - Discharge Note   Patient Details  Name: Vanessa Bonilla MRN: [id number] Date of Birth: 06/13/1946  Transition of [facility]) CM/SW Contact:  Heather DELENA Saltness, LCSW Phone Number: 03/25/2024, 2:34 PM   Clinical Narrative:    Pt discharging home today with HEP. Pt recommended for youth RW, ordered through [employer]. Youth RW to be delivered to bedside. No further TOC needs at this time.     Final next level of care: Home/Self Care Barriers to Discharge: Barriers Resolved   Patient Goals and CMS Choice Patient states their goals for this hospitalization and ongoing recovery are:: To return home    Discharge Placement  Home              Patient to be transferred to facility by: Family Name of family member notified: Patient Patient and family notified of of transfer: 03/25/24  Discharge Plan and Services Additional resources added to the After Visit Summary for  Follow Up                DME Arranged: [name] youth DME Agency: [employer] Date DME Agency Contacted: 03/25/24 Time DME Agency Contacted: 1434 Representative spoke with at DME Agency: [place] HH Arranged: NA HH Agency: NA        Social Drivers of Health (SDOH) Interventions SDOH Screenings   Food Insecurity: No Food Insecurity (03/24/2024)  Housing: Low Risk  (03/24/2024)  Transportation Needs: No Transportation Needs (03/24/2024)  Utilities: Not At Risk (03/24/2024)  Financial Resource Strain: Low Risk  (10/05/2019)   Received from [facility]  Physical Activity: Sufficiently Active (10/05/2019)   Received from [facility]  Social Connections: Moderately Isolated (03/24/2024)  Stress: No Stress Concern Present (10/05/2019)   Received from [facility]  Tobacco Use: Low Risk  (03/24/2024)     Readmission Risk Interventions     No data to display          Signed: Heather Saltness, MSW, LCSW Clinical Social Worker Inpatient Care Management 03/25/2024 2:35 PM

## 2024-03-25 NOTE — Progress Notes (Signed)
 Physical Therapy Treatment Patient Details Name: Vanessa Bonilla MRN: [id number] DOB: 11-05-1945 Today's Date: 03/25/2024   History of Present Illness Pt is 78 yo female s/p R anterior THA on 03/24/24.  Pt with hx including but not limited to HTN, DJD, OA, scoliosis sx, L THA    PT Comments  POD # 1 am session AxO x 3 pleasant Lady who plans to D/C to home today with help from her Daughter as Pt lives home alone. Assisted OOB to amb in hallway with progress.  Details below. Will need another session with Family this afternoon. Expect D/C to home after second session.    If plan is discharge home, recommend the following: A little help with walking and/or transfers;A little help with bathing/dressing/bathroom;Assistance with cooking/housework;Help with stairs or ramp for entrance   Can travel by private vehicle        Equipment Recommendations  Rolling walker (2 wheels) (YOUTH)    Recommendations for Other Services       Precautions / Restrictions Precautions Precautions: Fall Restrictions Weight Bearing Restrictions Per Provider Order: No RLE Weight Bearing Per Provider Order: Weight bearing as tolerated     Mobility  Bed Mobility Overal bed mobility: Needs Assistance Bed Mobility: Supine to Sit     Supine to sit: Supervision, Contact guard     General bed mobility comments: demonstarted and instructed how to use a strap to self assist LE off bed.  Required increased time.  Mild c/o feeling swimmy with position change.  Instructed to focus eyes forward until resolved.  Pt stated she has a HX Vertigo but has not recently been a problem except for the night before surgery.  The swimmyness did not last longer than 20 seconds.    Transfers Overall transfer level: Needs assistance Equipment used: Rolling walker (2 wheels) Transfers: Sit to/from Stand Sit to Stand: Supervision, Contact guard assist           General transfer comment: assisted OOB to [facility] due to  void urgency.  VC's on proper hand placemnt to push up and safety with turn completion.  Pt was able to self rise from [facility] as well as perform self peri care standing with Therapist ensuring balance.  Slightly unsteady with one hand on walker and other off performing task.    Ambulation/Gait Ambulation/Gait assistance: Supervision, Contact guard assist Gait Distance (Feet): 43 Feet Assistive device: Rolling walker (2 wheels)   Gait velocity: decreased     General Gait Details: tolerated an increased distance with reports of 4/10 hip pain.  No c/o swimmyness during amb.  Pt tolerated well.  [name] was too tall as B elbows too flexed.  Pt reports she has a walker at home and it adjusts.  (not sure it will adjust enough as Pt is barely 5 tall).  Asked Pt to have Family bring walker to hospital for imspection.   Stairs             Wheelchair Mobility     Tilt Bed    Modified Rankin (Stroke Patients Only)       Balance                                            Communication    Cognition Arousal: Alert Behavior During Therapy: WFL for tasks assessed/performed   PT - Cognitive impairments: No apparent impairments  PT - Cognition Comments: AxO x 3 pleasant Lady who plans to D/C to home today with help from her Daughter as Pt lives home alone. Following commands: Intact      Cueing    Exercises  Total Hip Replacement TE's following HEP Handout 10 reps ankle pumps 05 reps knee presses 05 reps heel slides 05 reps LAQ's Instructed how to use a belt loop to assist  Followed by ICE     General Comments        Pertinent Vitals/Pain Pain Assessment Pain Assessment: 0-10 Pain Score: 4  Pain Location: R hip Pain Descriptors / Indicators: Aching, Operative site guarding Pain Intervention(s): Monitored during session, Premedicated before session, Repositioned, Ice applied    Home Living                           Prior Function            PT Goals (current goals can now be found in the care plan section) Progress towards PT goals: Progressing toward goals    Frequency    7X/week      PT Plan      Co-evaluation              AM-PAC PT 6 Clicks Mobility   Outcome Measure  Help needed turning from your back to your side while in a flat bed without using bedrails?: A Little Help needed moving from lying on your back to sitting on the side of a flat bed without using bedrails?: A Little Help needed moving to and from a bed to a chair (including a wheelchair)?: A Little Help needed standing up from a chair using your arms (e.g., wheelchair or bedside chair)?: A Little Help needed to walk in hospital room?: A Little Help needed climbing 3-5 steps with a railing? : A Little 6 Click Score: 18    End of Session Equipment Utilized During Treatment: Gait belt Activity Tolerance: Patient tolerated treatment well Patient left: in chair;with call bell/phone within reach;with chair alarm set Nurse Communication: Mobility status PT Visit Diagnosis: Other abnormalities of gait and mobility (R26.89);Muscle weakness (generalized) (M62.81)     Time: 8956-8889 PT Time Calculation (min) (ACUTE ONLY): 27 min  Charges:    $Gait Training: 8-22 mins $Therapeutic Activity: 8-22 mins PT General Charges $$ ACUTE PT VISIT: 1 Visit                     Katheryn Leap  PTA Acute  Rehabilitation Services Office M-F          [phone]

## 2024-03-25 NOTE — Progress Notes (Signed)
Physical Therapy Treatment Patient Details Name: Vanessa Bonilla MRN: [id number] DOB: 1945-12-16 Today's Date: 03/25/2024   History of Present Illness Pt is 78 yo female s/p R anterior THA on 03/24/24.  Pt with hx including but not limited to HTN, DJD, OA, scoliosis sx, L THA    PT Comments  POD # 1 pm session with Daughter General transfer comment: Assisted from recliner to [facility] with Daughter.  VC's on proper hand placemnt to push up and safety with turn completion.  Pt was able to self rise from [facility] as well as perform self peri care standing with Therapist/Daughter ensuring balance.  Slightly unsteady with one hand on walker and other off performing task. General Gait Details: had Daughter hands on amb Pt a functional distance in hallway.  Mild c/o fatigue.  Tolerated well. General stair comments: with Daughter hands on assisted using safety belt, Pt was able to safely/correctly navigate up/down ONE step with her walker with proper sequencing.  Pt was self able to recall, up with the good and down with the bad. General bed mobility comments: demonstarted and instructed how to use a strap to self assist LE back onto bed.  Required increased time. Positioned to comfort and applied ICE to hip. Then returned to room to perform some TE's following HEP handout.  Instructed on proper tech, freq as well as use of ICE.   Walker from home was unable to adjust low enough for 5, so rec a YOUTH RW be delivered prior to D/C.   Addressed all mobility questions, discussed appropriate activity, educated on use of ICE.  Pt ready for D/C to home.    If plan is discharge home, recommend the following: A little help with walking and/or transfers;A little help with bathing/dressing/bathroom;Assistance with cooking/housework;Help with stairs or ramp for entrance   Can travel by private vehicle        Equipment Recommendations  Rolling walker (2 wheels) (YOUTH)    Recommendations for Other Services        Precautions / Restrictions Precautions Precautions: Fall Restrictions Weight Bearing Restrictions Per Provider Order: No RLE Weight Bearing Per Provider Order: Weight bearing as tolerated     Mobility  Bed Mobility Overal bed mobility: Needs Assistance Bed Mobility: Sit to Supine      Sit to supine: Min assist, Contact guard assist   General bed mobility comments: demonstarted and instructed how to use a strap to self assist LE back onto bed.  Required increased time. Positioned to comfort and applied ICE to hip.    Transfers Overall transfer level: Needs assistance Equipment used: Rolling walker (2 wheels) Transfers: Sit to/from Stand Sit to Stand: Supervision, Contact guard assist           General transfer comment: Assisted from recliner to [facility] with Daughter.  VC's on proper hand placemnt to push up and safety with turn completion.  Pt was able to self rise from [facility] as well as perform self peri care standing with Therapist/Daughter ensuring balance.  Slightly unsteady with one hand on walker and other off performing task.    Ambulation/Gait Ambulation/Gait assistance: Supervision, Contact guard assist Gait Distance (Feet): 38 Feet Assistive device: Rolling walker (2 wheels)   Gait velocity: decreased     General Gait Details: had Daughter hands on amb Pt a functional distance in hallway.  Mild c/o fatigue.  Tolerated well.   Stairs Stairs: Yes Stairs assistance: Supervision, Contact guard assist Stair Management: No rails, Forwards, With walker Number of  Stairs: 1 General stair comments: with Daughter hands on assisted using safety belt, Pt was able to safely/correctly navigate up/down ONE step with her walker with proper sequencing.  Pt was self able to recall, up with the good and down with the bad.   Wheelchair Mobility     Tilt Bed    Modified Rankin (Stroke Patients Only)       Balance                                             Communication    Cognition Arousal: Alert Behavior During Therapy: WFL for tasks assessed/performed   PT - Cognitive impairments: No apparent impairments                       PT - Cognition Comments: AxO x 3 pleasant Lady who plans to D/C to home today with help from her Daughter as Pt lives home alone. Following commands: Intact      Cueing    Exercises  Total Hip Replacement TE's following HEP Handout 10 reps ankle pumps 05 reps knee presses 05 reps heel slides 05 reps SAQ's 05 reps ABD Instructed how to use a belt loop to assist  Followed by ICE     General Comments        Pertinent Vitals/Pain Pain Assessment Pain Assessment: 0-10 Pain Score: 4  Pain Location: R hip Pain Descriptors / Indicators: Aching, Operative site guarding Pain Intervention(s): Monitored during session, Premedicated before session, Repositioned, Ice applied    Home Living                          Prior Function            PT Goals (current goals can now be found in the care plan section) Progress towards PT goals: Progressing toward goals    Frequency    7X/week      PT Plan      Co-evaluation              AM-PAC PT 6 Clicks Mobility   Outcome Measure  Help needed turning from your back to your side while in a flat bed without using bedrails?: A Little Help needed moving from lying on your back to sitting on the side of a flat bed without using bedrails?: A Little Help needed moving to and from a bed to a chair (including a wheelchair)?: A Little Help needed standing up from a chair using your arms (e.g., wheelchair or bedside chair)?: A Little Help needed to walk in hospital room?: A Little Help needed climbing 3-5 steps with a railing? : A Little 6 Click Score: 18    End of Session Equipment Utilized During Treatment: Gait belt Activity Tolerance: Patient tolerated treatment well Patient left: in bed;with call bell/phone within  reach;with family/visitor present Nurse Communication: Mobility status PT Visit Diagnosis: Other abnormalities of gait and mobility (R26.89);Muscle weakness (generalized) (M62.81)     Time: 8573-8542 PT Time Calculation (min) (ACUTE ONLY): 31 min  Charges:    $Gait Training: 8-22 mins $Therapeutic Exercise: 8-22 mins PT General Charges $$ ACUTE PT VISIT: 1 Visit                     Katheryn Leap  PTA Acute  Rehabilitation Services Office M-F  [phone]  

## 2024-03-26 NOTE — Discharge Summary (Signed)
 Patient ID: Vanessa Bonilla MRN: [id number] DOB/AGE: 1945/08/14 78 y.o.  Admit date: 03/24/2024 Discharge date: 03/25/2024  Admission Diagnoses:  Principal Problem:   OA (osteoarthritis) of hip Active Problems:   Primary osteoarthritis of right hip   Discharge Diagnoses:  Same  Past Medical History:  Diagnosis Date   Aortic regurgitation    Mild by echo 2022   DJD (degenerative joint disease), cervical    Hyperlipidemia    Hypertension    Left hip pain    Menopause    Mitral regurgitation    Mild by echo 2022   PAC (premature atrial contraction)    PONV (postoperative nausea and vomiting)    Primary osteoarthritis involving multiple joints    Recurrent UTI     Surgeries: Procedure(s): ARTHROPLASTY, HIP, TOTAL, ANTERIOR APPROACH on 03/24/2024   Consultants:   Discharged Condition: Improved  Hospital Course: Vanessa Bonilla is an 78 y.o. female who was admitted 03/24/2024 for operative treatment ofOA (osteoarthritis) of hip. Patient has severe unremitting pain that affects sleep, daily activities, and work/hobbies. After pre-op clearance the patient was taken to the operating room on 03/24/2024 and underwent  Procedure(s): ARTHROPLASTY, HIP, TOTAL, ANTERIOR APPROACH.    Patient was given perioperative antibiotics:  Anti-infectives (From admission, onward)    Start     Dose/Rate Route Frequency Ordered Stop   03/24/24 1700  ceFAZolin  (ANCEF ) IVPB 2g/100 mL premix        2 g 200 mL/hr over 30 Minutes Intravenous Every 6 hours 03/24/24 1345 03/25/24 0000   03/24/24 0830  ceFAZolin  (ANCEF ) IVPB 2g/100 mL premix        2 g 200 mL/hr over 30 Minutes Intravenous On call to O.R. 03/24/24 9176 03/24/24 1058        Patient was given sequential compression devices, early ambulation, and chemoprophylaxis to prevent DVT.  Patient benefited maximally from hospital stay and there were no complications.    Recent vital signs: Patient Vitals for the past 24 hrs:  BP Temp Temp  src Pulse Resp SpO2  03/25/24 1357 (!) 147/74 98.6 F (37 C) Oral 69 14 97 %  03/25/24 0802 (!) 165/83 98.7 F (37.1 C) Oral 71 14 99 %     Recent laboratory studies:  Recent Labs    03/25/24 0335  WBC 17.0*  HGB 10.9*  HCT 31.9*  PLT 238  NA 137  K 3.3*  CL 103  CO2 22  BUN 9  CREATININE 0.52  GLUCOSE 177*  CALCIUM 8.7*     Discharge Medications:   Allergies as of 03/25/2024       Reactions   Ammonium-containing Compounds Anaphylaxis, Nausea And Vomiting, Shortness Of Breath   Lidocaine  Hcl Rash, Swelling   Raloxifene Rash   Macrodantin [nitrofurantoin] Rash   Procaine Rash        Medication List     STOP taking these medications    diclofenac 75 MG EC tablet Commonly known as: VOLTAREN       TAKE these medications    acetaminophen  500 MG tablet Commonly known as: TYLENOL  Take 500 mg by mouth every 6 (six) hours as needed.   aspirin  81 MG chewable tablet Chew 1 tablet (81 mg total) by mouth 2 (two) times daily for 21 days. Then take one 81 mg aspirin  once a day for three weeks. Then discontinue aspirin .   Calcium Antacid Extra Strength 750 MG chewable tablet Generic drug: calcium carbonate Chew 1 tablet by mouth daily.   cetirizine 10  MG tablet Commonly known as: ZYRTEC Take 10 mg by mouth daily as needed for allergies.   cholecalciferol 25 MCG (1000 UNIT) tablet Commonly known as: VITAMIN D3 Take 1,000 Units by mouth daily.   Cranberry 500 MG Tabs Take 500 mg by mouth daily.   Dilt-XR 180 MG 24 hr capsule Generic drug: diltiazem  Take 180 mg by mouth daily.   estradiol 0.1 MG/GM vaginal cream Commonly known as: ESTRACE Place 0.5 Applicatorfuls vaginally 2 (two) times a week.   HYDROcodone -acetaminophen  5-325 MG tablet Commonly known as: NORCO/VICODIN Take 1-2 tablets by mouth every 6 (six) hours as needed for severe pain (pain score 7-10).   losartan  50 MG tablet Commonly known as: COZAAR  Take 50 mg by mouth in the morning and at  bedtime.   methocarbamol  500 MG tablet Commonly known as: ROBAXIN  Take 1 tablet (500 mg total) by mouth every 6 (six) hours as needed for muscle spasms.   metoprolol  succinate 50 MG 24 hr tablet Commonly known as: TOPROL -XL TAKE 1 TABLET BY MOUTH DAILY   multivitamin tablet Take 1 tablet by mouth daily.   ondansetron  4 MG tablet Commonly known as: ZOFRAN  Take 1 tablet (4 mg total) by mouth every 6 (six) hours as needed for nausea.   Refresh Plus 0.5 % Soln Generic drug: Carboxymethylcellulose Sod PF Place 1 drop into both eyes in the morning and at bedtime.   traMADol  50 MG tablet Commonly known as: ULTRAM  Take 1-2 tablets (50-100 mg total) by mouth every 6 (six) hours as needed for moderate pain (pain score 4-6).               Discharge Care Instructions  (From admission, onward)           Start     Ordered   03/25/24 0000  Weight bearing as tolerated        03/25/24 0737   03/25/24 0000  Change dressing       Comments: You have an adhesive waterproof bandage over the incision. Leave this in place until your first follow-up appointment. Once you remove this you will not need to place another bandage.   03/25/24 0737            Diagnostic Studies: DG Pelvis Portable Result Date: 03/24/2024 CLINICAL DATA:  Postop. EXAM: PORTABLE PELVIS 1-2 VIEWS COMPARISON:  None Available. FINDINGS: Right hip arthroplasty in expected alignment. No periprosthetic lucency or fracture. Recent postsurgical change includes air and edema in the soft tissues. Previous left hip arthroplasty. IMPRESSION: Right hip arthroplasty without immediate postoperative complication. Electronically Signed   By: Andrea Gasman M.D.   On: 03/24/2024 13:34   DG HIP UNILAT WITH PELVIS 1V RIGHT Result Date: 03/24/2024 CLINICAL DATA:  Elective surgery. EXAM: DG HIP (WITH OR WITHOUT PELVIS) 1V RIGHT COMPARISON:  None Available. FINDINGS: Four fluoroscopic spot views of the pelvis and right hip obtained  in the operating room. Images during hip arthroplasty. Fluoroscopy time 5 seconds. Dose 0.6946 mGy. Previous left hip arthroplasty. IMPRESSION: Intraoperative fluoroscopy during right hip arthroplasty. Electronically Signed   By: Andrea Gasman M.D.   On: 03/24/2024 13:33   DG C-Arm 1-60 Min-No Report Result Date: 03/24/2024 Fluoroscopy was utilized by the requesting physician.  No radiographic interpretation.   DG C-Arm 1-60 Min-No Report Result Date: 03/24/2024 Fluoroscopy was utilized by the requesting physician.  No radiographic interpretation.    Disposition: Discharge disposition: 01-Home or Self Care       Discharge Instructions  Call MD / Call 911   Complete by: As directed    If you experience chest pain or shortness of breath, CALL 911 and be transported to the hospital emergency room.  If you develope a fever above 101 F, pus (white drainage) or increased drainage or redness at the wound, or calf pain, call your surgeon's office.   Change dressing   Complete by: As directed    You have an adhesive waterproof bandage over the incision. Leave this in place until your first follow-up appointment. Once you remove this you will not need to place another bandage.   Constipation Prevention   Complete by: As directed    Drink plenty of fluids.  Prune juice may be helpful.  You may use a stool softener, such as Colace (over the counter) 100 mg twice a day.  Use MiraLax  (over the counter) for constipation as needed.   Diet - low sodium heart healthy   Complete by: As directed    Do not sit on low chairs, stoools or toilet seats, as it may be difficult to get up from low surfaces   Complete by: As directed    Driving restrictions   Complete by: As directed    No driving for two weeks   Post-operative opioid taper instructions:   Complete by: As directed    POST-OPERATIVE OPIOID TAPER INSTRUCTIONS: It is important to wean off of your opioid medication as soon as possible. If  you do not need pain medication after your surgery it is ok to stop day one. Opioids include: Codeine, Hydrocodone (Norco, Vicodin), Oxycodone (Percocet, oxycontin ) and hydromorphone amongst others.  Long term and even short term use of opiods can cause: Increased pain response Dependence Constipation Depression Respiratory depression And more.  Withdrawal symptoms can include Flu like symptoms Nausea, vomiting And more Techniques to manage these symptoms Hydrate well Eat regular healthy meals Stay active Use relaxation techniques(deep breathing, meditating, yoga) Do Not substitute Alcohol to help with tapering If you have been on opioids for less than two weeks and do not have pain than it is ok to stop all together.  Plan to wean off of opioids This plan should start within one week post op of your joint replacement. Maintain the same interval or time between taking each dose and first decrease the dose.  Cut the total daily intake of opioids by one tablet each day Next start to increase the time between doses. The last dose that should be eliminated is the evening dose.      TED hose   Complete by: As directed    Use stockings (TED hose) for three weeks on both leg(s).  You may remove them at night for sleeping.   Weight bearing as tolerated   Complete by: As directed         Follow-up Information     Melodi Lerner, MD. Go on 04/06/2024.   Specialty: Orthopedic Surgery Why: You are scheduled for a post op appointment on Tuesday 04/06/24 at 1:30pm Contact information: [address] [place] [address] [phone]                  Signed: Roxie Bonilla 03/26/2024, 7:52 AM

## 2024-05-21 ENCOUNTER — Other Ambulatory Visit: Payer: Self-pay | Admitting: Family Medicine

## 2024-05-21 DIAGNOSIS — D329 Benign neoplasm of meninges, unspecified: Secondary | ICD-10-CM

## 2024-06-03 ENCOUNTER — Other Ambulatory Visit (HOSPITAL_COMMUNITY): Payer: Medicare Other

## 2024-06-22 ENCOUNTER — Ambulatory Visit (HOSPITAL_COMMUNITY)
Admission: RE | Admit: 2024-06-22 | Discharge: 2024-06-22 | Disposition: A | Source: Ambulatory Visit | Attending: Cardiology | Admitting: Cardiology

## 2024-06-22 DIAGNOSIS — I340 Nonrheumatic mitral (valve) insufficiency: Secondary | ICD-10-CM | POA: Diagnosis not present

## 2024-06-22 DIAGNOSIS — I351 Nonrheumatic aortic (valve) insufficiency: Secondary | ICD-10-CM | POA: Insufficient documentation

## 2024-06-22 LAB — ECHOCARDIOGRAM COMPLETE
Area-P 1/2: 2.72 cm2
P 1/2 time: 612 ms
S' Lateral: 2.7 cm

## 2024-06-23 ENCOUNTER — Ambulatory Visit: Payer: Self-pay | Admitting: Cardiology

## 2024-06-23 ENCOUNTER — Encounter: Payer: Self-pay | Admitting: Cardiology

## 2024-08-05 ENCOUNTER — Ambulatory Visit
Admission: RE | Admit: 2024-08-05 | Discharge: 2024-08-05 | Disposition: A | Source: Ambulatory Visit | Attending: Family Medicine | Admitting: Family Medicine

## 2024-08-05 DIAGNOSIS — D329 Benign neoplasm of meninges, unspecified: Secondary | ICD-10-CM

## 2024-08-05 MED ORDER — GADOPICLENOL 0.5 MMOL/ML IV SOLN
5.0000 mL | Freq: Once | INTRAVENOUS | Status: AC | PRN
  Administered 2024-08-05: 5 mL via INTRAVENOUS

## 2024-09-03 ENCOUNTER — Ambulatory Visit: Admitting: Physician Assistant
